# Patient Record
Sex: Female | Born: 1965 | Hispanic: No | Marital: Married | Smoking: Never smoker
Health system: Southern US, Community
[De-identification: ages and names within clinical notes are randomized; demographics above are authoritative.]

## PROBLEM LIST (undated history)

## (undated) DIAGNOSIS — M722 Plantar fascial fibromatosis: Secondary | ICD-10-CM

## (undated) DIAGNOSIS — I10 Essential (primary) hypertension: Secondary | ICD-10-CM

## (undated) DIAGNOSIS — N946 Dysmenorrhea, unspecified: Secondary | ICD-10-CM

## (undated) DIAGNOSIS — N809 Endometriosis, unspecified: Secondary | ICD-10-CM

## (undated) HISTORY — PX: DILATION AND CURETTAGE OF UTERUS: SHX78

---

## 2012-06-01 ENCOUNTER — Encounter (HOSPITAL_COMMUNITY): Payer: Self-pay | Admitting: *Deleted

## 2012-06-01 ENCOUNTER — Emergency Department (HOSPITAL_COMMUNITY)
Admission: EM | Admit: 2012-06-01 | Discharge: 2012-06-01 | Disposition: A | Discharge status: Home / Self Care | Payer: 59 | Source: Home / Self Care | Attending: Emergency Medicine | Admitting: Emergency Medicine

## 2012-06-01 DIAGNOSIS — M7700 Medial epicondylitis, unspecified elbow: Secondary | ICD-10-CM

## 2012-06-01 DIAGNOSIS — M5412 Radiculopathy, cervical region: Secondary | ICD-10-CM

## 2012-06-01 MED ORDER — MELOXICAM 15 MG PO TABS: 15.0000 mg | ORAL_TABLET | Freq: Every day | ORAL | Status: AC

## 2012-06-01 MED ORDER — PREDNISONE 10 MG PO TABS: ORAL_TABLET | ORAL | Status: DC

## 2012-06-01 NOTE — ED Notes (Signed)
Pt  Reports  Symptoms  Of  l  Arm  Weakness       X  Se  Months   With pain l  Elbow region  -  Pt  Reports      The  Symptoms  Worse  This  Am     -  She  denys  Any specefic  Injury      She  denys   Any headache     -  Speech is  Clear  Ambulated  With a  Steady  Fluid  Gait

## 2012-06-01 NOTE — ED Provider Notes (Signed)
Chief Complaint  Patient presents with  . 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 She's had a 6 month history of intermittent left arm numbness from the shoulder on down to the tips of the fingers, weakness, and pain. This occurs intermittently and seems to be worse with hard physical work and better with rest. She has some aching and stiffness in the neck, but has a full range of motion, and the pain in the arm is not brought on by neck movement. There is nothing particular that seems to bring on the arm pain except for hard physical labor. Particularly movement of the shoulder or the elbow does not reproduce the pain. She notes cramping in the muscles of the arm with any type of work. She also has medial elbow pain and swelling. This is worse if she lifts. She denies any right arm symptoms with the exception of pain at the base of the right thumb and triggering of the right thumb.  Review of Systems:  Other than noted above, the patient denies any of the following symptoms: Systemic:  No fevers, chills, sweats, or aches.  No fatigue or tiredness. Musculoskeletal:  No joint pain, arthritis, bursitis, swelling, back pain, or neck pain. Neurological:  No muscular weakness, paresthesias, headache, or trouble with speech or coordination.  No dizziness.   PMFSH:  Past medical history, family history, social history, meds, and allergies were reviewed.  Physical Exam:   Vital signs:  BP 144/93  Pulse 84  Temp 98.6 F (37 C) (Oral)  Resp 18  SpO2 100%  LMP 05/28/2012 Gen:  Alert and oriented times 3.  In no distress. Musculoskeletal: She has slight pain to palpation over the trapezius ridges in her neck had a full range of motion with slight pain at the endpoints of movement. There was no pain to palpation over the shoulder. The shoulder have full range of motion with slight pain on abduction, flexion, and internal and  external rotation. There is no pain to palpation the upper arm. She has pain to palpation over the medial epicondyle but not over the lateral epicondyle and there is none over the antecubital fossa. The elbow joint has a full range of motion with no pain and is no pain over the forearm, wrist, hand, MCP joints, PIP joints, or DIP joints. Tinel's sign was negative but Phalen sign was positive. Otherwise, all joints had a full a ROM with no swelling, bruising or deformity.  No edema, pulses full. Extremities were warm and pink.  Capillary refill was brisk.  Skin:  Clear, warm and dry.  No rash. Neuro:  Alert and oriented times 3.  Muscle strength was normal.  Sensation was intact to light touch.   Assessment:  The primary encounter diagnosis was Medial epicondylitis. A diagnosis of Cervical radiculopathy was also pertinent to this visit.  Plan:   1.  The following meds were prescribed:   New Prescriptions   MELOXICAM (MOBIC) 15 MG TABLET    Take 1 tablet (15 mg total) by mouth daily.   PREDNISONE (DELTASONE) 10 MG TABLET    Take 4 tabs daily for 4 days, 3 tabs daily for 4 days, 2 tabs daily for 4 days, then 1 tab daily for 4 days.   2.  The patient was instructed in symptomatic care, including rest and activity, elevation, application of ice and compression.  Appropriate handouts were given. 3.  The patient was told to return if becoming worse in any way, if no better in 3 or 4 days, and given some red flag symptoms that would indicate earlier return.   4.  The patient was told to follow up with physical therapy for a month. If no better at the end of that time suggested she come back here at that point I think it would be time for her to see both an orthopedic surgeon for the medial epicondylitis and a neurosurgeon for the cervical radiculopathy. She may benefit from an MRI of the neck and possibly epidural steroids.   Reuben Likes, MD 06/01/12 [phone]

## 2012-06-27 ENCOUNTER — Ambulatory Visit: Payer: 59 | Admitting: Rehabilitative and Restorative Service Providers"

## 2012-06-27 ENCOUNTER — Ambulatory Visit: Payer: 59 | Attending: Emergency Medicine

## 2012-06-27 DIAGNOSIS — M542 Cervicalgia: Secondary | ICD-10-CM | POA: Insufficient documentation

## 2012-06-27 DIAGNOSIS — M25519 Pain in unspecified shoulder: Secondary | ICD-10-CM | POA: Insufficient documentation

## 2012-06-27 DIAGNOSIS — IMO0001 Reserved for inherently not codable concepts without codable children: Secondary | ICD-10-CM | POA: Insufficient documentation

## 2012-06-28 NOTE — ED Notes (Signed)
Referral for PT faxed 7/31 and confirmation received. First appt. Was 8/6 @ 1045.  Dr. Lorenz Coaster notified referral completed. Vassie Moselle 06/28/2012

## 2012-07-03 ENCOUNTER — Ambulatory Visit: Payer: 59 | Admitting: Rehabilitative and Restorative Service Providers"

## 2012-07-06 ENCOUNTER — Ambulatory Visit: Payer: 59 | Admitting: Rehabilitative and Restorative Service Providers"

## 2012-07-10 ENCOUNTER — Encounter: Payer: 59 | Admitting: Rehabilitative and Restorative Service Providers"

## 2012-07-13 ENCOUNTER — Ambulatory Visit: Payer: 59 | Admitting: Rehabilitative and Restorative Service Providers"

## 2013-04-02 ENCOUNTER — Ambulatory Visit: Payer: 59 | Admitting: Internal Medicine

## 2014-05-15 ENCOUNTER — Encounter (HOSPITAL_COMMUNITY): Payer: Self-pay | Admitting: Emergency Medicine

## 2014-05-15 ENCOUNTER — Emergency Department (HOSPITAL_COMMUNITY)
Admission: EM | Admit: 2014-05-15 | Discharge: 2014-05-15 | Disposition: A | Discharge status: Home / Self Care | Payer: 59 | Source: Home / Self Care | Attending: Emergency Medicine | Admitting: Emergency Medicine

## 2014-05-15 DIAGNOSIS — N939 Abnormal uterine and vaginal bleeding, unspecified: Secondary | ICD-10-CM

## 2014-05-15 DIAGNOSIS — N898 Other specified noninflammatory disorders of vagina: Secondary | ICD-10-CM

## 2014-05-15 LAB — POCT I-STAT, CHEM 8
BUN: 8 mg/dL (ref 6–23)
CALCIUM ION: 1.21 mmol/L (ref 1.12–1.23)
CHLORIDE: 104 meq/L (ref 96–112)
Creatinine, Ser: 0.6 mg/dL (ref 0.50–1.10)
Glucose, Bld: 99 mg/dL (ref 70–99)
HEMATOCRIT: 29 % — AB (ref 36.0–46.0)
Hemoglobin: 9.9 g/dL — ABNORMAL LOW (ref 12.0–15.0)
Potassium: 3.7 mEq/L (ref 3.7–5.3)
SODIUM: 141 meq/L (ref 137–147)
TCO2: 23 mmol/L (ref 0–100)

## 2014-05-15 LAB — POCT PREGNANCY, URINE: PREG TEST UR: NEGATIVE

## 2014-05-15 MED ORDER — FERROUS SULFATE 325 (65 FE) MG PO TABS: 325.0000 mg | ORAL_TABLET | Freq: Every day | ORAL | Status: DC

## 2014-05-15 NOTE — Discharge Instructions (Signed)
Abnormal Uterine Bleeding Abnormal uterine bleeding can affect women at various stages in life, including teenagers, women in their reproductive years, pregnant women, and women who have reached menopause. Several kinds of uterine bleeding are considered abnormal, including:  Bleeding or spotting between periods.   Bleeding after sexual intercourse.   Bleeding that is heavier or more than normal.   Periods that last longer than usual.  Bleeding after menopause.  Many cases of abnormal uterine bleeding are minor and simple to treat, while others are more serious. Any type of abnormal bleeding should be evaluated by your health care provider. Treatment will depend on the cause of the bleeding. HOME CARE INSTRUCTIONS Monitor your condition for any changes. The following actions may help to alleviate any discomfort you are experiencing:  Avoid the use of tampons and douches as directed by your health care provider.  Change your pads frequently. You should get regular pelvic exams and Pap tests. Keep all follow-up appointments for diagnostic tests as directed by your health care provider.  SEEK MEDICAL CARE IF:   Your bleeding lasts more than 1 week.   You feel dizzy at times.  SEEK IMMEDIATE MEDICAL CARE IF:   You pass out.   You are changing pads every 15 to 30 minutes.   You have abdominal pain.  You have a fever.   You become sweaty or weak.   You are passing large blood clots from the vagina.   You start to feel nauseous and vomit. MAKE SURE YOU:   Understand these instructions.  Will watch your condition.  Will get help right away if you are not doing well or get worse. Document Released: 11/08/2005 Document Revised: 11/13/2013 Document Reviewed: 06/07/2013 Exit[employer] Patient Information 2015 [employer]. This information is not intended to replace advice given to you by your health care provider. Make sure you discuss any questions you have with your  health care provider.  

## 2014-05-15 NOTE — ED Notes (Signed)
Patient here today for prolong vaginal bleeding.  Reports having period in beginning of may and bleeding has continued.  Spouse and patient report she cannot sleep and they have concerns for this.  Patient has established a new patient appt with dr Manuela Schwartz [facility].  appt scheduled for 6/29.  Denies pain.

## 2014-05-15 NOTE — ED Provider Notes (Signed)
CSN: [id number]     Arrival date & time 05/15/14  3300 History   First MD Initiated Contact with Patient 05/15/14 [phone]     Chief Complaint  Patient presents with  . Vaginal Bleeding   (Consider location/radiation/quality/duration/timing/severity/associated sxs/prior Treatment) Patient is a 48 y.o. female presenting with vaginal bleeding. The history is provided by the patient. No language interpreter was used.  Vaginal Bleeding Quality:  Clots and bright red Severity:  Moderate Onset quality:  Gradual Duration:  2 months Timing:  Constant Progression:  Worsening Chronicity:  New Menstrual history:  Irregular and missed period Possible pregnancy: no   Relieved by:  Nothing Worsened by:  Nothing tried Ineffective treatments:  None tried Associated symptoms: no abdominal pain   Pt is scheduled to see Gyn on Friday  History reviewed. No pertinent past medical history. Past Surgical History  Procedure Laterality Date  . Cesarean section     History reviewed. No pertinent family history. History  Substance Use Topics  . Smoking status: Not on file  . Smokeless tobacco: Not on file  . Alcohol Use:    OB History   Grav Para Term Preterm Abortions TAB SAB Ect Mult Living                 Review of Systems  Gastrointestinal: Negative for abdominal pain.  Genitourinary: Positive for vaginal bleeding.  All other systems reviewed and are negative.   Allergies  Review of patient's allergies indicates no known allergies.  Home Medications   Prior to Admission medications   Medication Sig Start Date End Date Taking? Authorizing Provider  predniSONE (DELTASONE) 10 MG tablet Take 4 tabs daily for 4 days, 3 tabs daily for 4 days, 2 tabs daily for 4 days, then 1 tab daily for 4 days. 06/01/12   Harden Mo, MD   BP 154/89  Pulse 79  Temp(Src) 98.2 F (36.8 C) (Oral)  Resp 16  SpO2 100% Physical Exam  Nursing note and vitals reviewed. Constitutional: She is oriented to  person, place, and time. She appears well-developed and well-nourished.  HENT:  Head: Normocephalic.  Eyes: Conjunctivae are normal. Pupils are equal, round, and reactive to light.  Neck: Normal range of motion. Neck supple.  Cardiovascular: Normal rate.   Pulmonary/Chest: Effort normal.  Abdominal: Soft.  Genitourinary: Vaginal discharge found.  Adnexa no mass,  Uterus  No enlargement  Musculoskeletal: Normal range of motion.  Neurological: She is alert and oriented to person, place, and time. She has normal reflexes.  Skin: Skin is warm.  Psychiatric: She has a normal mood and affect.    ED Course  Procedures (including critical care time) Labs Review Labs Reviewed - No data to display  Imaging Review No results found.   MDM   1. Abnormal vaginal bleeding    Iron,  Follow up with gyn as scheduled    Fransico Meadow, PA-C 05/15/14 1135

## 2014-05-16 NOTE — ED Provider Notes (Signed)
Medical screening examination/treatment/procedure(s) were performed by a resident physician or non-physician practitioner and as the supervising physician I was immediately available for consultation/collaboration.  Lynne Leader, MD    Gregor Hams, MD 05/16/14 [phone]

## 2014-06-23 ENCOUNTER — Inpatient Hospital Stay (HOSPITAL_COMMUNITY)
Admission: AD | Admit: 2014-06-23 | Discharge: 2014-06-23 | Disposition: A | Discharge status: Home / Self Care | Payer: 59 | Source: Ambulatory Visit | Attending: Obstetrics and Gynecology | Admitting: Obstetrics and Gynecology

## 2014-06-23 ENCOUNTER — Encounter (HOSPITAL_COMMUNITY): Payer: Self-pay | Admitting: *Deleted

## 2014-06-23 DIAGNOSIS — N898 Other specified noninflammatory disorders of vagina: Secondary | ICD-10-CM

## 2014-06-23 DIAGNOSIS — N938 Other specified abnormal uterine and vaginal bleeding: Secondary | ICD-10-CM | POA: Insufficient documentation

## 2014-06-23 DIAGNOSIS — R5381 Other malaise: Secondary | ICD-10-CM | POA: Insufficient documentation

## 2014-06-23 DIAGNOSIS — N949 Unspecified condition associated with female genital organs and menstrual cycle: Secondary | ICD-10-CM | POA: Insufficient documentation

## 2014-06-23 DIAGNOSIS — R5383 Other fatigue: Secondary | ICD-10-CM

## 2014-06-23 DIAGNOSIS — N939 Abnormal uterine and vaginal bleeding, unspecified: Secondary | ICD-10-CM

## 2014-06-23 HISTORY — DX: Dysmenorrhea, unspecified: N94.6

## 2014-06-23 LAB — CBC
HCT: 28 % — ABNORMAL LOW (ref 36.0–46.0)
Hemoglobin: 8.7 g/dL — ABNORMAL LOW (ref 12.0–15.0)
MCH: 23.3 pg — ABNORMAL LOW (ref 26.0–34.0)
MCHC: 31.1 g/dL (ref 30.0–36.0)
MCV: 74.9 fL — ABNORMAL LOW (ref 78.0–100.0)
PLATELETS: 357 10*3/uL (ref 150–400)
RBC: 3.74 MIL/uL — ABNORMAL LOW (ref 3.87–5.11)
RDW: 20.4 % — AB (ref 11.5–15.5)
WBC: 5.4 10*3/uL (ref 4.0–10.5)

## 2014-06-23 LAB — BASIC METABOLIC PANEL
ANION GAP: 14 (ref 5–15)
BUN: 14 mg/dL (ref 6–23)
CHLORIDE: 105 meq/L (ref 96–112)
CO2: 20 mEq/L (ref 19–32)
Calcium: 8.5 mg/dL (ref 8.4–10.5)
Creatinine, Ser: 0.64 mg/dL (ref 0.50–1.10)
GFR calc non Af Amer: >90 mL/min (ref >90)
Glucose, Bld: 85 mg/dL (ref 70–99)
POTASSIUM: 3.7 meq/L (ref 3.7–5.3)
SODIUM: 139 meq/L (ref 137–147)

## 2014-06-23 LAB — WET PREP, GENITAL
Clue Cells Wet Prep HPF POC: NONE SEEN
TRICH WET PREP: NONE SEEN
YEAST WET PREP: NONE SEEN

## 2014-06-23 LAB — POCT PREGNANCY, URINE: PREG TEST UR: NEGATIVE

## 2014-06-23 MED ORDER — MEGESTROL ACETATE 20 MG PO TABS: ORAL_TABLET | ORAL | Status: DC

## 2014-06-23 NOTE — MAU Note (Signed)
Pt presents to MAU with complaints of heavy vaginal bleeding that started on July the 21st and is still having bleeding. Reports feeling weak and tired.

## 2014-06-23 NOTE — MAU Provider Note (Signed)
CSN: [id number]     Arrival date & time 06/23/14  1629 History   None    Chief Complaint  Patient presents with  . Vaginal Bleeding     (Consider location/radiation/quality/duration/timing/severity/associated sxs/prior Treatment) HPI Andrea Long is 48 y.o. G3P1011 who presents to the MAU with heavy vaginal bleeding. She states that she has had the problem for over a month. She was evaluated in the office and had an ultrasound that showed adenomyosis. She was started on Megace and iron.  She reports the bleeding has been heavy since July 21st and now she is feeling weak and dizzy. She has a follow up appointment with DR. Bovard toward the end of the month but felt like she could not wait until then.    Past Medical History  Diagnosis Date  . Dysmenorrhea    Past Surgical History  Procedure Laterality Date  . Cesarean section     No family history on file. History  Substance Use Topics  . Smoking status: Never Smoker   . Smokeless tobacco: Never Used  . Alcohol Use: No   OB History   Grav Para Term Preterm Abortions TAB SAB Ect Mult Living   3 2 1  1  1   1      Review of Systems  Gastrointestinal: Positive for abdominal pain.  Genitourinary: Positive for vaginal bleeding.  Neurological: Positive for light-headedness.  All other systems are negative    Allergies  Review of patient's allergies indicates no known allergies.  Home Medications   Prior to Admission medications   Medication Sig Start Date End Date Taking? Authorizing Provider  ferrous sulfate 325 (65 FE) MG tablet Take 1 tablet (325 mg total) by mouth daily. 05/15/14  Yes Hollace Kinnier Sofia, PA-C  ibuprofen (ADVIL,MOTRIN) 200 MG tablet Take 400 mg by mouth every 4 (four) hours as needed for moderate pain.   Yes Historical Provider, MD  megestrol (MEGACE) 40 MG tablet Take 40 mg by mouth daily.   Yes Historical Provider, MD  megestrol (MEGACE) 20 MG tablet Take two tablets (40 mg) three times per day time three  days,  then take two tablets (40 mg) two times per day time three days,  then take two tablets (40 mg)once per day 06/23/14   Ashley Murrain, NP   BP 156/86  Pulse 85  Temp(Src) 98.8 F (37.1 C) (Oral)  Resp 18  Ht 4\' 10"  (1.473 m)  Wt 162 lb (73.483 kg)  BMI 33.87 kg/m2  LMP 06/11/2014 Physical Exam  Nursing note and vitals reviewed. Constitutional: She is oriented to person, place, and time. She appears well-developed and well-nourished.  HENT:  Head: Normocephalic.  Eyes: EOM are normal.  Neck: Neck supple.  Cardiovascular: Normal rate.   Pulmonary/Chest: Effort normal.  Abdominal: Soft. There is no tenderness.  Genitourinary:  External genitalia without lesions, moderate blood vaginal vault. No CMT, no adnexal tenderness. Uterus slightly enlarged.   Musculoskeletal: Normal range of motion.  Neurological: She is alert and oriented to person, place, and time. No cranial nerve deficit.  Skin: Skin is warm and dry.  Psychiatric: She has a normal mood and affect. Her behavior is normal.    ED Course  Procedures  MDM   Results for orders placed during the hospital encounter of 06/23/14 (from the past 24 hour(s))  POCT PREGNANCY, URINE     Status: None   Collection Time    06/23/14  5:19 PM      Result Value Ref  Range   Preg Test, Ur NEGATIVE  NEGATIVE  CBC     Status: Abnormal   Collection Time    06/23/14  5:40 PM      Result Value Ref Range   WBC 5.4  4.0 - 10.5 K/uL   RBC 3.74 (*) 3.87 - 5.11 MIL/uL   Hemoglobin 8.7 (*) 12.0 - 15.0 g/dL   HCT 28.0 (*) 36.0 - 46.0 %   MCV 74.9 (*) 78.0 - 100.0 fL   MCH 23.3 (*) 26.0 - 34.0 pg   MCHC 31.1  30.0 - 36.0 g/dL   RDW 20.4 (*) 11.5 - 15.5 %   Platelets 357  150 - 400 K/uL  BASIC METABOLIC PANEL     Status: None   Collection Time    06/23/14  5:40 PM      Result Value Ref Range   Sodium 139  137 - 147 mEq/L   Potassium 3.7  3.7 - 5.3 mEq/L   Chloride 105  96 - 112 mEq/L   CO2 20  19 - 32 mEq/L   Glucose, Bld 85  70  - 99 mg/dL   BUN 14  6 - 23 mg/dL   Creatinine, Ser 0.64  0.50 - 1.10 mg/dL   Calcium 8.5  8.4 - 10.5 mg/dL   GFR calc non Af Amer >90  >90 mL/min   GFR calc Af Amer >90  >90 mL/min   Anion gap 14  5 - 15  WET PREP, GENITAL     Status: Abnormal   Collection Time    06/23/14  5:57 PM      Result Value Ref Range   Yeast Wet Prep HPF POC NONE SEEN  NONE SEEN   Trich, Wet Prep NONE SEEN  NONE SEEN   Clue Cells Wet Prep HPF POC NONE SEEN  NONE SEEN   WBC, Wet Prep HPF POC FEW (*) NONE SEEN    48 y.o. female with abnormal vaginal bleeding and feeling tired. Has appointment for follow up with Dr. Melba Coon for endometrial biopsy. Stable for discharge. I spoke with Dr. Melba Coon and she will try to move the appointment to see the patient sooner. Discussed with the patient and all questioned fully answered. She will return if any problems arise. Will increase dose of Megace and start Fe.    Medication List         ferrous sulfate 325 (65 FE) MG tablet  Take 1 tablet (325 mg total) by mouth daily.     ibuprofen 200 MG tablet  Commonly known as:  ADVIL,MOTRIN  Take 400 mg by mouth every 4 (four) hours as needed for moderate pain.     megestrol 20 MG tablet  Commonly known as:  MEGACE  - Take two tablets (40 mg) three times per day time three days,   - then take two tablets (40 mg) two times per day time three days,   - then take two tablets (40 mg)once per day

## 2014-06-24 LAB — GC/CHLAMYDIA PROBE AMP
CT Probe RNA: NEGATIVE
GC PROBE AMP APTIMA: NEGATIVE

## 2014-09-23 ENCOUNTER — Encounter (HOSPITAL_COMMUNITY): Payer: Self-pay | Admitting: *Deleted

## 2014-11-22 DIAGNOSIS — N809 Endometriosis, unspecified: Secondary | ICD-10-CM

## 2014-11-22 HISTORY — DX: Endometriosis, unspecified: N80.9

## 2014-12-06 ENCOUNTER — Encounter: Payer: Self-pay | Admitting: Internal Medicine

## 2014-12-06 ENCOUNTER — Ambulatory Visit (INDEPENDENT_AMBULATORY_CARE_PROVIDER_SITE_OTHER): Payer: 59 | Admitting: Internal Medicine

## 2014-12-06 VITALS — BP 143/72 | HR 67 | Temp 98.1°F | Ht 60.0 in | Wt 160.1 lb

## 2014-12-06 DIAGNOSIS — S63602A Unspecified sprain of left thumb, initial encounter: Secondary | ICD-10-CM

## 2014-12-06 DIAGNOSIS — Z23 Encounter for immunization: Secondary | ICD-10-CM

## 2014-12-06 DIAGNOSIS — N924 Excessive bleeding in the premenopausal period: Secondary | ICD-10-CM

## 2014-12-06 DIAGNOSIS — D500 Iron deficiency anemia secondary to blood loss (chronic): Secondary | ICD-10-CM | POA: Insufficient documentation

## 2014-12-06 NOTE — Patient Instructions (Signed)
General Instructions: I want you to take 2 Ibuprofen every 6 hours as needed for you thumb pain, putting ice on it may help to.  You can also buy an over the counter thumb splint to help with it.  If it does not get better within 1-2 weeks please come back otherwise we can see you in 6 months.  Please bring your medicines with you each time you come to clinic.  Medicines may include prescription medications, over-the-counter medications, herbal remedies, eye drops, vitamins, or other pills.   Progress Toward Treatment Goals:  No flowsheet data found.  Self Care Goals & Plans:  No flowsheet data found.  No flowsheet data found.   Care Management & Community Referrals:  No flowsheet data found.

## 2014-12-06 NOTE — Progress Notes (Signed)
[facility] Subjective:   Patient ID: Andrea Long female   DOB: 1966-07-29 49 y.o.   MRN: [id number]  HPI: [name] is a 49 y.o. filipino female with a PMH dysmenorrhea who presents to our clinic to establish care. She has previously had all of her medical care at her OB/GYN Dr. Melba Coon.   She is married and has one child.  She works at [employer]. She immigrated to the [place] in 2012.  She reports she was received her vaccinations that were required at that time and will bring those records to her next visit. She reports her only complaint today his left thumb pain that started one week ago.  She does not remember what she was doing when it started.  She did not have trauma to the thumb.  She has not taken any medication for the pain.  She notes it is worse with use especially at work. She does not have an history of fractures.   Past Medical History  Diagnosis Date  . Dysmenorrhea    Current Outpatient Prescriptions  Medication Sig Dispense Refill  . ferrous sulfate 325 (65 FE) MG tablet Take 1 tablet (325 mg total) by mouth daily. 30 tablet 0  . megestrol (MEGACE) 20 MG tablet Take two tablets (40 mg) three times per day time three days,  then take two tablets (40 mg) two times per day time three days,  then take two tablets (40 mg)once per day 40 tablet 0  . ibuprofen (ADVIL,MOTRIN) 200 MG tablet Take 400 mg by mouth every 4 (four) hours as needed for moderate pain.     No current facility-administered medications for this visit.   No family history on file. History   Social History  . Marital Status: Married    Spouse Name: N/A    Number of Children: N/A  . Years of Education: N/A   Social History Main Topics  . Smoking status: Never Smoker   . Smokeless tobacco: Never Used  . Alcohol Use: No  . Drug Use: No  . Sexual Activity: Yes    Birth Control/ Protection: Surgical   Other Topics Concern  . None   Social History  Narrative   Review of Systems: Review of Systems  Constitutional: Negative for fever, chills and weight loss.  Eyes: Negative for blurred vision.  Respiratory: Negative for cough and shortness of breath.   Cardiovascular: Negative for chest pain.  Gastrointestinal: Negative for abdominal pain, diarrhea, constipation, blood in stool and melena.  Musculoskeletal: Positive for joint pain. Negative for myalgias, back pain, falls and neck pain.  Neurological: Negative for headaches.  Psychiatric/Behavioral: Negative for depression.     Objective:  Physical Exam: Filed Vitals:   12/06/14 1517  BP: 143/72  Pulse: 67  Temp: 98.1 F (36.7 C)  TempSrc: Oral  Height: 5' (1.524 m)  Weight: 160 lb 1.6 oz (72.621 kg)  SpO2: 100%  Physical Exam  Constitutional: She is well-developed, well-nourished, and in no distress.  Cardiovascular: Normal rate, regular rhythm and normal heart sounds.   Pulmonary/Chest: Effort normal and breath sounds normal. She has no wheezes.  Abdominal: Soft. Bowel sounds are normal.  Musculoskeletal:       Hands: Psychiatric: Affect normal.  Nursing note and vitals reviewed.   Assessment & Plan:  Case discussed with Dr. Dareen Piano  Perimenopausal menorrhagia -Will obtain records from Dr. Roe Rutherford office, consent paperwork obtained from patient.   Iron deficiency anemia due to chronic blood  loss - Will hold off on repeating CBC or other bloodwork and obtain records from Dr. Roe Rutherford office.   Left thumb sprain -History and physical consistant with left thumb sprain, recommended patient use OTC NSAIDs.  Patient instructed she may also buy OTC splint for her tumb.  Instructed to RTC if pain worsens or does not improve within 1-2 weeks.     Medications Ordered No orders of the defined types were placed in this encounter.   Other Orders Orders Placed This Encounter  Procedures  . Flu Vaccine QUAD 36+ mos IM

## 2014-12-08 NOTE — Assessment & Plan Note (Addendum)
-  History and physical consistant with left thumb sprain, recommended patient use OTC NSAIDs.  Patient instructed she may also buy OTC splint for her tumb.  Instructed to RTC if pain worsens or does not improve within 1-2 weeks.

## 2014-12-08 NOTE — Assessment & Plan Note (Signed)
-   Will hold off on repeating CBC or other bloodwork and obtain records from Dr. Roe Rutherford office.

## 2014-12-08 NOTE — Assessment & Plan Note (Addendum)
-  Will obtain records from Dr. Roe Rutherford office, consent paperwork obtained from patient.

## 2014-12-11 NOTE — Progress Notes (Signed)
INTERNAL MEDICINE TEACHING ATTENDING ADDENDUM - Ranveer Wahlstrom, MD: I reviewed and discussed at the time of visit with the resident Dr. Hoffman, the patient's medical history, physical examination, diagnosis and results of pertinent tests and treatment and I agree with the patient's care as documented.  

## 2014-12-20 ENCOUNTER — Ambulatory Visit: Payer: 59 | Admitting: Internal Medicine

## 2015-03-05 ENCOUNTER — Encounter: Payer: Self-pay | Admitting: Internal Medicine

## 2015-03-05 ENCOUNTER — Ambulatory Visit (INDEPENDENT_AMBULATORY_CARE_PROVIDER_SITE_OTHER): Payer: 59 | Admitting: Internal Medicine

## 2015-03-05 VITALS — BP 141/86 | HR 67 | Temp 98.1°F | Wt 160.0 lb

## 2015-03-05 DIAGNOSIS — D500 Iron deficiency anemia secondary to blood loss (chronic): Secondary | ICD-10-CM

## 2015-03-05 DIAGNOSIS — Z23 Encounter for immunization: Secondary | ICD-10-CM

## 2015-03-05 DIAGNOSIS — E669 Obesity, unspecified: Secondary | ICD-10-CM | POA: Diagnosis not present

## 2015-03-05 DIAGNOSIS — IMO0001 Reserved for inherently not codable concepts without codable children: Secondary | ICD-10-CM | POA: Insufficient documentation

## 2015-03-05 DIAGNOSIS — R030 Elevated blood-pressure reading, without diagnosis of hypertension: Secondary | ICD-10-CM

## 2015-03-05 DIAGNOSIS — Z6831 Body mass index (BMI) 31.0-31.9, adult: Secondary | ICD-10-CM | POA: Diagnosis not present

## 2015-03-05 DIAGNOSIS — M722 Plantar fascial fibromatosis: Secondary | ICD-10-CM | POA: Insufficient documentation

## 2015-03-05 DIAGNOSIS — Z0000 Encounter for general adult medical examination without abnormal findings: Secondary | ICD-10-CM | POA: Insufficient documentation

## 2015-03-05 DIAGNOSIS — J302 Other seasonal allergic rhinitis: Secondary | ICD-10-CM

## 2015-03-05 LAB — LIPID PANEL
Cholesterol: 122 mg/dL (ref 0–200)
HDL: 38 mg/dL — ABNORMAL LOW (ref >=46)
LDL CALC: 56 mg/dL (ref 0–99)
Total CHOL/HDL Ratio: 3.2 Ratio
Triglycerides: 142 mg/dL (ref <150)
VLDL: 28 mg/dL (ref 0–40)

## 2015-03-05 LAB — CBC WITH DIFFERENTIAL/PLATELET
BASOS PCT: 0 % (ref 0–1)
Basophils Absolute: 0 10*3/uL (ref 0.0–0.1)
Eosinophils Absolute: 0.4 10*3/uL (ref 0.0–0.7)
Eosinophils Relative: 5 % (ref 0–5)
HEMATOCRIT: 36.6 % (ref 36.0–46.0)
Hemoglobin: 12.4 g/dL (ref 12.0–15.0)
LYMPHS ABS: 1.7 10*3/uL (ref 0.7–4.0)
Lymphocytes Relative: 23 % (ref 12–46)
MCH: 30.6 pg (ref 26.0–34.0)
MCHC: 33.9 g/dL (ref 30.0–36.0)
MCV: 90.4 fL (ref 78.0–100.0)
MONOS PCT: 8 % (ref 3–12)
MPV: 10.8 fL (ref 8.6–12.4)
Monocytes Absolute: 0.6 10*3/uL (ref 0.1–1.0)
NEUTROS ABS: 4.9 10*3/uL (ref 1.7–7.7)
NEUTROS PCT: 64 % (ref 43–77)
PLATELETS: 296 10*3/uL (ref 150–400)
RBC: 4.05 MIL/uL (ref 3.87–5.11)
RDW: 13.2 % (ref 11.5–15.5)
WBC: 7.6 10*3/uL (ref 4.0–10.5)

## 2015-03-05 MED ORDER — MOMETASONE FUROATE 50 MCG/ACT NA SUSP: 2.0000 | Freq: Every day | NASAL | Status: DC

## 2015-03-05 MED ORDER — CETIRIZINE HCL 10 MG PO TABS: 10.0000 mg | ORAL_TABLET | Freq: Every day | ORAL | Status: DC

## 2015-03-05 NOTE — Progress Notes (Signed)
[facility] Subjective:   Patient ID: Andrea Long female   DOB: 1965/11/29 49 y.o.   MRN: [id number]  HPI: Andrea Long is a 49 y.o. female with a PMH detailed below who presents for 3 month follow up.  She recently established care here.  She has a history of dysmenorrhea and IDA.  She has been cared for by GYN and she reports this issue has been resolved.  She notes that her energy has improved and she believes her blood counts are back to normal.  She is interested in getting up to date on her blood work and immunizations.  She does report that her thumb pain has resolved, it occasionally comes back and she take ibuprofen as needed for it.  She is complaining today of some occasional right heel pain that is worse in the morning and gets better throughout the day.  She denies any trauma to the area.  She does report a cough for the 2-3 weeks, this has coincided with nasal congestion, post nasal drip and itchy eyes.  She reports she has recently started taking Zyrtec OTC with some relief but is wondering about a nasal spray like her husband takes.  Past Medical History  Diagnosis Date  . Dysmenorrhea    Current Outpatient Prescriptions  Medication Sig Dispense Refill  . cetirizine (ZYRTEC ALLERGY) 10 MG tablet Take 1 tablet (10 mg total) by mouth daily.    . ferrous sulfate 325 (65 FE) MG tablet Take 1 tablet (325 mg total) by mouth daily. 30 tablet 0  . ibuprofen (ADVIL,MOTRIN) 200 MG tablet Take 400 mg by mouth every 4 (four) hours as needed for moderate pain.    . megestrol (MEGACE) 20 MG tablet Take two tablets (40 mg) three times per day time three days,  then take two tablets (40 mg) two times per day time three days,  then take two tablets (40 mg)once per day 40 tablet 0  . mometasone (NASONEX) 50 MCG/ACT nasal spray Place 2 sprays into the nose daily. 17 g 2   No current facility-administered medications for this visit.   No family history  on file. History   Social History  . Marital Status: Married    Spouse Name: N/A  . Number of Children: N/A  . Years of Education: N/A   Social History Main Topics  . Smoking status: Never Smoker   . Smokeless tobacco: Never Used  . Alcohol Use: No  . Drug Use: No  . Sexual Activity: Yes    Birth Control/ Protection: Surgical   Other Topics Concern  . None   Social History Narrative   Review of Systems: Review of Systems  Constitutional: Negative for fever, chills, weight loss and malaise/fatigue.  HENT: Positive for congestion.   Eyes: Negative for blurred vision.  Respiratory: Positive for cough. Negative for shortness of breath.   Cardiovascular: Negative for chest pain and leg swelling.  Gastrointestinal: Negative for heartburn and abdominal pain.  Genitourinary: Negative for dysuria.  Musculoskeletal: Negative for myalgias.  Neurological: Negative for dizziness and headaches.  Endo/Heme/Allergies: Negative for polydipsia.  Psychiatric/Behavioral: Negative for substance abuse.     Objective:  Physical Exam: Filed Vitals:   03/05/15 1600  BP: 141/86  Pulse: 67  Temp: 98.1 F (36.7 C)  TempSrc: Oral  Weight: 160 lb (72.576 kg)  SpO2: 100%  Physical Exam  Constitutional: She is well-developed, well-nourished, and in no distress. No distress.  HENT:  Head: Normocephalic and  atraumatic.  Eyes: Conjunctivae are normal.  Cardiovascular: Normal rate, regular rhythm, normal heart sounds and intact distal pulses.   No murmur heard. Pulmonary/Chest: Effort normal and breath sounds normal. She has no wheezes. She exhibits no tenderness.  Abdominal: Soft. Bowel sounds are normal.  Musculoskeletal: She exhibits no edema.  Right heel pain at insertion of plantar fascia to calcaneus.  Skin: Skin is warm and dry. She is not diaphoretic.  Psychiatric: Affect and judgment normal.  Nursing note and vitals reviewed.    Assessment & Plan:  Case discussed with Dr.  Ellwood Dense  Elevated blood pressure She again has a very mildly elevated BP with a systolic of 702.  I am hesitant to label her as a hypertensive at this time.  I discussed this with her and educated her about low salt diet, and increasing exercise. - She will follow up for recheck in 3 months.   Iron deficiency anemia due to chronic blood loss -Repeat CBC today shows Hgb back to normal.   Health care maintenance - Patient has been getting regular mammorgrams and pap smears from OBGYN -Interested in Tdap vaccine today - order lipid panel.   Obesity (BMI 30.0-34.9) -We discussed her BMI of 31 and weight loss strategies.   Plantar fasciitis of right foot - Discussed exercises, using tennis ball and ice bottle for treatment.  Can use occasional NSAIDs.   Seasonal allergies - Recommended she continue Zyrtec 10mg  daily - Can add Nasal spray will Rx Nasonex but patient informed she could also use OTC nasocort or flonase.     Medications Ordered Meds ordered this encounter  Medications  . cetirizine (ZYRTEC ALLERGY) 10 MG tablet    Sig: Take 1 tablet (10 mg total) by mouth daily.  . mometasone (NASONEX) 50 MCG/ACT nasal spray    Sig: Place 2 sprays into the nose daily.    Dispense:  17 g    Refill:  2   Other Orders Orders Placed This Encounter  Procedures  . Tdap vaccine greater than or equal to 7yo IM  . CBC with Diff  . Lipid Profile

## 2015-03-05 NOTE — Patient Instructions (Signed)
General Instructions: Remember to exercise and limit salt intake.  Please bring your medicines with you each time you come to clinic.  Medicines may include prescription medications, over-the-counter medications, herbal remedies, eye drops, vitamins, or other pills.   Progress Toward Treatment Goals:  No flowsheet data found.  Self Care Goals & Plans:  No flowsheet data found.  No flowsheet data found.   Care Management & Community Referrals:  No flowsheet data found.

## 2015-03-05 NOTE — Assessment & Plan Note (Signed)
She again has a very mildly elevated BP with a systolic of 707.  I am hesitant to label her as a hypertensive at this time.  I discussed this with her and educated her about low salt diet, and increasing exercise. - She will follow up for recheck in 3 months.

## 2015-03-05 NOTE — Progress Notes (Signed)
Internal Medicine Clinic Attending  Case discussed with Dr. Hoffman at the time of the visit.  We reviewed the resident's history and exam and pertinent patient test results.  I agree with the assessment, diagnosis, and plan of care documented in the resident's note.  

## 2015-03-06 ENCOUNTER — Encounter: Payer: Self-pay | Admitting: Internal Medicine

## 2015-03-06 NOTE — Assessment & Plan Note (Signed)
-   Recommended she continue Zyrtec 10mg  daily - Can add Nasal spray will Rx Nasonex but patient informed she could also use OTC nasocort or flonase.

## 2015-03-06 NOTE — Assessment & Plan Note (Signed)
-   Discussed exercises, using tennis ball and ice bottle for treatment.  Can use occasional NSAIDs.

## 2015-03-06 NOTE — Assessment & Plan Note (Signed)
-  We discussed her BMI of 31 and weight loss strategies.

## 2015-03-06 NOTE — Assessment & Plan Note (Signed)
-  Repeat CBC today shows Hgb back to normal.

## 2015-03-06 NOTE — Assessment & Plan Note (Signed)
-   Patient has been getting regular mammorgrams and pap smears from OBGYN -Interested in Tdap vaccine today - order lipid panel.

## 2015-03-07 ENCOUNTER — Ambulatory Visit: Payer: 59 | Admitting: Internal Medicine

## 2015-08-29 ENCOUNTER — Ambulatory Visit (INDEPENDENT_AMBULATORY_CARE_PROVIDER_SITE_OTHER): Payer: 59 | Admitting: *Deleted

## 2015-08-29 DIAGNOSIS — Z23 Encounter for immunization: Secondary | ICD-10-CM

## 2015-09-22 ENCOUNTER — Ambulatory Visit (INDEPENDENT_AMBULATORY_CARE_PROVIDER_SITE_OTHER): Payer: 59 | Admitting: Internal Medicine

## 2015-09-22 ENCOUNTER — Ambulatory Visit (INDEPENDENT_AMBULATORY_CARE_PROVIDER_SITE_OTHER): Payer: 59

## 2015-09-22 ENCOUNTER — Encounter: Payer: Self-pay | Admitting: Internal Medicine

## 2015-09-22 ENCOUNTER — Ambulatory Visit (INDEPENDENT_AMBULATORY_CARE_PROVIDER_SITE_OTHER): Payer: 59 | Admitting: Podiatry

## 2015-09-22 ENCOUNTER — Encounter: Payer: Self-pay | Admitting: Podiatry

## 2015-09-22 VITALS — BP 131/83 | HR 74 | Resp 16

## 2015-09-22 VITALS — BP 132/71 | HR 57 | Temp 98.3°F | Ht <= 58 in | Wt 154.3 lb

## 2015-09-22 DIAGNOSIS — M62838 Other muscle spasm: Secondary | ICD-10-CM

## 2015-09-22 DIAGNOSIS — L309 Dermatitis, unspecified: Secondary | ICD-10-CM

## 2015-09-22 DIAGNOSIS — M79673 Pain in unspecified foot: Secondary | ICD-10-CM

## 2015-09-22 DIAGNOSIS — M722 Plantar fascial fibromatosis: Secondary | ICD-10-CM | POA: Diagnosis not present

## 2015-09-22 MED ORDER — TRIAMCINOLONE ACETONIDE 10 MG/ML IJ SUSP
10.0000 mg | Freq: Once | INTRAMUSCULAR | Status: AC
  Administered 2015-09-22: 10 mg

## 2015-09-22 MED ORDER — MELOXICAM 15 MG PO TABS: 15.0000 mg | ORAL_TABLET | Freq: Every day | ORAL | Status: DC

## 2015-09-22 MED ORDER — CYCLOBENZAPRINE HCL 10 MG PO TABS: 10.0000 mg | ORAL_TABLET | Freq: Three times a day (TID) | ORAL | Status: DC | PRN

## 2015-09-22 NOTE — Patient Instructions (Addendum)
Andrea Long it was nice seeing you today.  -Please take Flexeril 10 mg: 1 tablet by mouth every 8 hours as needed for neck muscle spasms.  You may get drowsy or dizzy when you first start taking the medicine or change doses. Do not drive, use machinery, or do anything that may be dangerous until you know how the medicine affects you. Stand or sit up slowly.  -Hot showers and heating pads may also help.

## 2015-09-22 NOTE — Progress Notes (Signed)
   Subjective:    Patient ID: Andrea Long, female    DOB: May 12, 1966, 49 y.o.   MRN: [id number]  HPI Pt presents with right heel pain x 2 months and dermatitis on the lateral side of her right foot, she c/o itching to area. Heel pain has lasted for awhile, worse in the morning, using nsaids   Review of Systems  All other systems reviewed and are negative.      Objective:   Physical Exam        Assessment & Plan:

## 2015-09-22 NOTE — Patient Instructions (Signed)

## 2015-09-23 DIAGNOSIS — M62838 Other muscle spasm: Secondary | ICD-10-CM | POA: Insufficient documentation

## 2015-09-23 NOTE — Progress Notes (Signed)
Patient ID: Andrea Long, female   DOB: 1966/06/12, 49 y.o.   MRN: [id number]   Subjective:   Patient ID: Andrea Long female   DOB: 1966/09/19 49 y.o.   MRN: [id number]  HPI: [name] is a 49 y.o. female with a past medical history of dysmenorrhea presenting to the clinic with stiffness of neck for the past 1 week. Patient states her neck hurts whenever she tries to look down or sideways. Taking hot showers helps with the stiffness and pain. Denies having any trauma to the neck area. States she works at [employer] for [employer] and her job requires her to look up and down a lot. Denies having any headaches, rash, fevers, chills, nausea, or vomiting. Denies having any weakness, numbness, or tingling in her upper extremities. No other complaints.    Past Medical History  Diagnosis Date  . Dysmenorrhea    Current Outpatient Prescriptions  Medication Sig Dispense Refill  . cetirizine (ZYRTEC ALLERGY) 10 MG tablet Take 1 tablet (10 mg total) by mouth daily.    . cyclobenzaprine (FLEXERIL) 10 MG tablet Take 1 tablet (10 mg total) by mouth every 8 (eight) hours as needed for muscle spasms. 30 tablet 0  . ferrous sulfate 325 (65 FE) MG tablet Take 1 tablet (325 mg total) by mouth daily. 30 tablet 0  . ibuprofen (ADVIL,MOTRIN) 200 MG tablet Take 400 mg by mouth every 4 (four) hours as needed for moderate pain.    . megestrol (MEGACE) 20 MG tablet Take two tablets (40 mg) three times per day time three days,  then take two tablets (40 mg) two times per day time three days,  then take two tablets (40 mg)once per day 40 tablet 0  . meloxicam (MOBIC) 15 MG tablet Take 1 tablet (15 mg total) by mouth daily. 30 tablet 2  . mometasone (NASONEX) 50 MCG/ACT nasal spray Place 2 sprays into the nose daily. 17 g 2   No current facility-administered medications for this visit.   No family history on file. Social History   Social History  . Marital Status: Married    Spouse  Name: N/A  . Number of Children: N/A  . Years of Education: N/A   Social History Main Topics  . Smoking status: Never Smoker   . Smokeless tobacco: Never Used  . Alcohol Use: No  . Drug Use: No  . Sexual Activity: Yes    Birth Control/ Protection: Surgical   Other Topics Concern  . None   Social History Narrative   Review of Systems: Review of Systems  Constitutional: Negative for fever and chills.  HENT: Negative for ear pain.   Eyes: Negative for blurred vision and pain.  Respiratory: Negative for cough, sputum production, shortness of breath and wheezing.   Cardiovascular: Negative for chest pain and leg swelling.  Gastrointestinal: Negative for nausea, vomiting, abdominal pain, diarrhea and constipation.  Musculoskeletal: Positive for neck pain. Negative for myalgias and back pain.  Skin: Negative for itching and rash.  Neurological: Negative for dizziness, sensory change, focal weakness and headaches.   Objective:  Physical Exam: Filed Vitals:   09/22/15 1017  BP: 132/71  Pulse: 57  Temp: 98.3 F (36.8 C)  TempSrc: Oral  Height: 4\' 10"  (1.473 m)  Weight: 154 lb 4.8 oz (69.99 kg)  SpO2: 100%   Physical Exam  Constitutional: She is oriented to person, place, and time. She appears well-developed and well-nourished. No distress.  HENT:  Head: Normocephalic  and atraumatic.  Eyes: EOM are normal. Pupils are equal, round, and reactive to light.  Neck: Neck supple. No tracheal deviation present.  Cardiovascular: Normal rate, regular rhythm and intact distal pulses.   Pulmonary/Chest: Effort normal and breath sounds normal. No respiratory distress. She has no wheezes. She has no rales.  Abdominal: Soft. Bowel sounds are normal. She exhibits no distension. There is no tenderness.  Musculoskeletal: She exhibits tenderness. She exhibits no edema.  Patient is able to extend her neck, however, has decreased range of motion with flexion and lateral rotation of the neck. Neck  muscle stiffness and tenderness on palpation in the distribution of the trapezius muscle. No tenderness on palpation of the cervical spine.  Neurological: She is alert and oriented to person, place, and time.  Strength and sensation grossly intact in bilateral upper extremities.  Skin: Skin is warm and dry.   Assessment & Plan:

## 2015-09-23 NOTE — Assessment & Plan Note (Signed)
Patient complaining of stiffness and decreased ROM with flexion and lateral rotation of neck for the past 1 week. Taking hot showers helps with the stiffness and pain. Denies having any trauma to the neck area. States she works at [employer] for [employer] and her job requires her to look up and down a lot, which could possibly be putting strain on her neck muscles. Denies having any headaches, rash, fevers, chills, nausea, or vomiting. Meningitis is less likely. Denies having any weakness, numbness, or tingling in her upper extremities. No pain with extension of the neck. Cervical spondylosis is less likely. Upon physical examination, patient is able to extend her neck, however, has decreased range of motion with flexion and lateral rotation. She also exhibits neck muscle stiffness and tenderness on palpation in the distribution of the trapezius muscle. No tenderness on palpation of the cervical spine. Strength and sensation grossly intact in bilateral upper extremities. Her symptoms are likely due to neck muscle spasms. -Flexeril 10 mg every 8 when necessary. I reminded the patient that this medication can make her drowsy and that she cannot drive or operate heavy machinery while taking it.

## 2015-09-24 NOTE — Progress Notes (Signed)
I saw and evaluated the patient.  I personally confirmed the key portions of Dr. Rathore's history and exam and reviewed pertinent patient test results.  The assessment, diagnosis, and plan were formulated together and I agree with the documentation in the resident's note. 

## 2015-09-24 NOTE — Progress Notes (Signed)
Subjective:     Patient ID: Andrea Long, female   DOB: 1966-11-18, 49 y.o.   MRN: [id number]  HPI patient presents stating she has a lot of pain in her right heel for the last several months and she's had some itching on the outside of her foot with no history. Patient states the pain is worse in the morning after sitting she's tried anti-inflammatory and shoe gear modifications   Review of Systems  All other systems reviewed and are negative.      Objective:   Physical Exam  Constitutional: She is oriented to person, place, and time.  Cardiovascular: Intact distal pulses.   Musculoskeletal: Normal range of motion.  Neurological: She is oriented to person, place, and time.  Skin: Skin is warm.  Nursing note and vitals reviewed.  neurovascular status found to be intact with muscle strength adequate range of motion within normal limits. Patient is found to have moderate depression of the arch does have obesity which is complicating factor and is found to have significant discomfort in the medial fascial band right at the insertional point of the tendon the calcaneus. There is some discoloration of tissue lateral side right foot but it's localized in nature with no indication of any trauma or other inflammatory manifestation     Assessment:     Acute plantar fasciitis right with inflammation and structural changes of the feet along with dermatitis lateral right    Plan:     H&P and x-rays reviewed and today I injected the right plantar fascia 3 mg Kenalog 5 mg Xylocaine and advised on cortisone cream for the lateral foot and dispensed fascial brace with instructions. Gave instructions on physical therapy and reappoint to recheck

## 2015-09-29 ENCOUNTER — Ambulatory Visit: Payer: 59 | Admitting: Podiatry

## 2016-02-11 ENCOUNTER — Ambulatory Visit (INDEPENDENT_AMBULATORY_CARE_PROVIDER_SITE_OTHER): Payer: BLUE CROSS/BLUE SHIELD | Admitting: Podiatry

## 2016-02-11 ENCOUNTER — Encounter: Payer: Self-pay | Admitting: Podiatry

## 2016-02-11 VITALS — BP 131/77 | HR 84 | Resp 16

## 2016-02-11 DIAGNOSIS — M722 Plantar fascial fibromatosis: Secondary | ICD-10-CM | POA: Diagnosis not present

## 2016-02-11 MED ORDER — TRIAMCINOLONE ACETONIDE 10 MG/ML IJ SUSP
10.0000 mg | Freq: Once | INTRAMUSCULAR | Status: AC
  Administered 2016-02-11: 10 mg

## 2016-02-12 ENCOUNTER — Telehealth: Payer: Self-pay | Admitting: Internal Medicine

## 2016-02-12 NOTE — Progress Notes (Signed)
Subjective:     Patient ID: Andrea Long, female   DOB: 1966-04-22, 50 y.o.   MRN: [id number]  HPI patient presents with a flareup of pain in the right plantar fascia with inflammation fluid around the medial band   Review of Systems     Objective:   Physical Exam Neurovascular status intact muscle strength adequate with inflammatory fasciitis right at the insertional point tendon into the calcaneus    Assessment:     Acute plantar fasciitis right    Plan:     Advised on physical therapy supportive shoe gear usage and I injected the plantar fascia right 3 mg Kenalog 5 g Xylocaine and reappoint to recheck

## 2016-02-12 NOTE — Telephone Encounter (Signed)
APPT REMINDER CALL, NO ANSWER, MAIL BOX FULL °

## 2016-02-13 ENCOUNTER — Encounter: Payer: Self-pay | Admitting: Internal Medicine

## 2016-02-13 ENCOUNTER — Ambulatory Visit (INDEPENDENT_AMBULATORY_CARE_PROVIDER_SITE_OTHER): Payer: BLUE CROSS/BLUE SHIELD | Admitting: Internal Medicine

## 2016-02-13 DIAGNOSIS — Z6830 Body mass index (BMI) 30.0-30.9, adult: Secondary | ICD-10-CM

## 2016-02-13 DIAGNOSIS — M62838 Other muscle spasm: Secondary | ICD-10-CM | POA: Diagnosis not present

## 2016-02-13 DIAGNOSIS — E669 Obesity, unspecified: Secondary | ICD-10-CM

## 2016-02-13 NOTE — Progress Notes (Signed)
   Subjective:    Patient ID: Andrea Long, female    DOB: 14-Jan-1966, 50 y.o.   MRN: [id number]  HPI  Andrea Long is a 50 year old woman with a PMH as below who comes to the clinic to discuss mild neck pain and obesity. She indicates that her neck pain is particularly worse after she's been looking at her phone for extended periods of time, which she does frequently. To address her mild obesity, she has been eating fewer carbohydrates and eating more vegetables and protein. She exercises at least 5 times weekly.   Active Ambulatory Problems    Diagnosis Date Noted  . Perimenopausal menorrhagia 12/06/2014  . Plantar fasciitis of right foot 03/05/2015  . Health care maintenance 03/05/2015  . Seasonal allergies 03/05/2015  . Obesity (BMI 30.0-34.9) 03/05/2015  . Muscle spasms of neck 09/23/2015   Resolved Ambulatory Problems    Diagnosis Date Noted  . Left thumb sprain 12/06/2014  . Iron deficiency anemia due to chronic blood loss 12/06/2014  . Elevated blood pressure 03/05/2015   Past Medical History  Diagnosis Date  . Dysmenorrhea      Review of Systems  Constitutional: Negative for fatigue and unexpected weight change.  Respiratory: Negative for shortness of breath and wheezing.   Musculoskeletal: Positive for neck pain and neck stiffness.  Psychiatric/Behavioral: Negative for dysphoric mood. The patient is not nervous/anxious.        Objective:   Physical Exam  Constitutional: She appears well-developed and well-nourished.  HENT:  Mouth/Throat: Oropharynx is clear and moist. No oropharyngeal exudate.  Neck: Normal range of motion. Neck supple.  Cardiovascular: Normal rate, regular rhythm and intact distal pulses.   Pulmonary/Chest: Effort normal and breath sounds normal. No respiratory distress. She has no wheezes.  Abdominal: Soft. Bowel sounds are normal. She exhibits no distension. There is no tenderness.  Lymphadenopathy:    She has no cervical adenopathy.    Skin: Skin is warm and dry.  Psychiatric: She has a normal mood and affect. Her behavior is normal.          Assessment & Plan:   Please see problem based assessment and plan for details.

## 2016-02-13 NOTE — Patient Instructions (Signed)
Andrea Long,  It was a pleasure seeing you and your husband today. You are in [place] [facility].   Please continue your diet and exercise regimen.

## 2016-02-15 NOTE — Assessment & Plan Note (Signed)
A: She remains borderline low obese, but is making sustained improvement.  P: I encouraged her continued weight loss plan

## 2016-02-15 NOTE — Assessment & Plan Note (Signed)
A: Patient is not currently taking any medication for this. It seems her neck strain is brought on solely by sustained overflexion.  P: Counseled to cut back on smartphone usage and to lift phone up to prevent overflexion.

## 2016-02-16 NOTE — Progress Notes (Signed)
Internal Medicine Clinic Attending  Case discussed with Dr. Ford at the time of the visit.  We reviewed the resident's history and exam and pertinent patient test results.  I agree with the assessment, diagnosis, and plan of care documented in the resident's note.  

## 2016-02-20 ENCOUNTER — Inpatient Hospital Stay (HOSPITAL_COMMUNITY)
Admission: AD | Admit: 2016-02-20 | Discharge: 2016-02-20 | Disposition: A | Discharge status: Home / Self Care | Payer: BLUE CROSS/BLUE SHIELD | Source: Ambulatory Visit | Attending: Obstetrics and Gynecology | Admitting: Obstetrics and Gynecology

## 2016-02-20 ENCOUNTER — Encounter (HOSPITAL_COMMUNITY): Payer: Self-pay

## 2016-02-20 DIAGNOSIS — N939 Abnormal uterine and vaginal bleeding, unspecified: Secondary | ICD-10-CM | POA: Insufficient documentation

## 2016-02-20 HISTORY — DX: Endometriosis, unspecified: N80.9

## 2016-02-20 HISTORY — DX: Plantar fascial fibromatosis: M72.2

## 2016-02-20 LAB — CBC
HEMATOCRIT: 29.3 % — AB (ref 36.0–46.0)
HEMOGLOBIN: 9.9 g/dL — AB (ref 12.0–15.0)
MCH: 29.8 pg (ref 26.0–34.0)
MCHC: 33.8 g/dL (ref 30.0–36.0)
MCV: 88.3 fL (ref 78.0–100.0)
Platelets: 239 10*3/uL (ref 150–400)
RBC: 3.32 MIL/uL — ABNORMAL LOW (ref 3.87–5.11)
RDW: 15.1 % (ref 11.5–15.5)
WBC: 6.9 10*3/uL (ref 4.0–10.5)

## 2016-02-20 LAB — URINALYSIS, ROUTINE W REFLEX MICROSCOPIC
Bilirubin Urine: NEGATIVE
GLUCOSE, UA: NEGATIVE mg/dL
Nitrite: POSITIVE — AB
PH: 5 (ref 5.0–8.0)
Protein, ur: >300 mg/dL — AB
SPECIFIC GRAVITY, URINE: 1.025 (ref 1.005–1.030)

## 2016-02-20 LAB — URINE MICROSCOPIC-ADD ON: Squamous Epithelial / LPF: NONE SEEN

## 2016-02-20 MED ORDER — MEGESTROL ACETATE 40 MG PO TABS: ORAL_TABLET | ORAL | Status: DC

## 2016-02-20 NOTE — MAU Note (Signed)
Have had to periods this month. Started bleeding this past Monday for second time. Diagnosed last yr with endometriosis. Just had annual exam last Friday. Dark circles around eyes. Some nausea. Used about 7 pads today and they were soaked. Some clots

## 2016-02-20 NOTE — Discharge Instructions (Signed)

## 2016-02-20 NOTE — Progress Notes (Signed)
Written and verbal d/c instructions given and understanding voiced. 

## 2016-02-20 NOTE — MAU Provider Note (Signed)
Chief Complaint: Vaginal Bleeding   First Provider Initiated Contact with Patient 02/20/16 2033      SUBJECTIVE HPI: Andrea Long is a 50 y.o. G3P1011 who presents to maternity admissions reporting onset of heavy menstrual bleeding this morning.  She reports her LMP was 2 weeks ago but was not this heavy. Nothing makes the bleeding better or worse.  She had episodes of heavy vaginal bleeding last year and was treated with Megace, which helped.  She sees Dr Sandford Craze and has discussed surgical options to treat bleeding but has not desired surgery before now.  She denies dizziness, h/a, chest pain or palpitations but does reports some off and on nausea today associated with the bleeding and reports her eyes are dark underneath and more sunken than usual.  She denies STD risks. She denies vaginal itching/burning, urinary symptoms, h/a, dizziness, n/v, or fever/chills.     HPI  Past Medical History  Diagnosis Date  . Dysmenorrhea   . Endometriosis 2016  . Plantar fasciitis of right foot    Past Surgical History  Procedure Laterality Date  . Cesarean section     Social History   Social History  . Marital Status: Married    Spouse Name: N/A  . Number of Children: N/A  . Years of Education: N/A   Occupational History  . Not on file.   Social History Main Topics  . Smoking status: Never Smoker   . Smokeless tobacco: Never Used  . Alcohol Use: No  . Drug Use: No  . Sexual Activity: Yes    Birth Control/ Protection: Surgical   Other Topics Concern  . Not on file   Social History Narrative   No current facility-administered medications on file prior to encounter.   No current outpatient prescriptions on file prior to encounter.   No Known Allergies  ROS:  Review of Systems  Constitutional: Negative for fatigue.  Respiratory: Negative for shortness of breath.   Cardiovascular: Negative for chest pain and palpitations.  Genitourinary: Positive for vaginal  bleeding and pelvic pain. Negative for difficulty urinating and vaginal pain.  Neurological: Negative for dizziness.  Psychiatric/Behavioral: Negative.      I have reviewed patient's Past Medical Hx, Surgical Hx, Family Hx, Social Hx, medications and allergies.   Physical Exam   Patient Vitals for the past 24 hrs:  BP Temp Pulse Resp SpO2 Height Weight  02/20/16 2109 118/72 mmHg - 67 18 - - -  02/20/16 1942 154/78 mmHg 98.3 F (36.8 C) 70 18 100 % 5\' 1"  (1.549 m) 159 lb 6.4 oz (72.303 kg)   Constitutional: Well-developed, well-nourished female in no acute distress.  Cardiovascular: normal rate Respiratory: normal effort GI: Abd soft, non-tender. Pos BS x 4 MS: Extremities nontender, no edema, normal ROM Neurologic: Alert and oriented x 4.  GU: Neg CVAT.  PELVIC EXAM: Cervix pink, visually closed, without lesion, moderate amount dark red bleeding without clots, vaginal walls and external genitalia normal Bimanual exam: Cervix 0/long/high, firm, anterior, neg CMT, uterus nontender, nonenlarged, adnexa without tenderness, enlargement, or mass   LAB RESULTS Results for orders placed or performed during the hospital encounter of 02/20/16 (from the past 24 hour(s))  Urinalysis, Routine w reflex microscopic (not at [facility])     Status: Abnormal   Collection Time: 02/20/16  7:50 PM  Result Value Ref Range   Color, Urine RED (A) YELLOW   APPearance CLOUDY (A) CLEAR   Specific Gravity, Urine 1.025 1.005 - 1.030   pH 5.0  5.0 - 8.0   Glucose, UA NEGATIVE NEGATIVE mg/dL   Hgb urine dipstick LARGE (A) NEGATIVE   Bilirubin Urine NEGATIVE NEGATIVE   Ketones, ur TRACE (A) NEGATIVE mg/dL   Protein, ur >300 (A) NEGATIVE mg/dL   Nitrite POSITIVE (A) NEGATIVE   Leukocytes, UA SMALL (A) NEGATIVE  Urine microscopic-add on     Status: Abnormal   Collection Time: 02/20/16  7:50 PM  Result Value Ref Range   Squamous Epithelial / LPF NONE SEEN NONE SEEN   WBC, UA 0-5 0 - 5 WBC/hpf   RBC / HPF TOO  NUMEROUS TO COUNT 0 - 5 RBC/hpf   Bacteria, UA RARE (A) NONE SEEN   Urine-Other MUCOUS PRESENT   CBC     Status: Abnormal   Collection Time: 02/20/16  8:10 PM  Result Value Ref Range   WBC 6.9 4.0 - 10.5 K/uL   RBC 3.32 (L) 3.87 - 5.11 MIL/uL   Hemoglobin 9.9 (L) 12.0 - 15.0 g/dL   HCT 29.3 (L) 36.0 - 46.0 %   MCV 88.3 78.0 - 100.0 fL   MCH 29.8 26.0 - 34.0 pg   MCHC 33.8 30.0 - 36.0 g/dL   RDW 15.1 11.5 - 15.5 %   Platelets 239 150 - 400 K/uL       IMAGING No results found.  MAU Management/MDM: Ordered labs and reviewed results.  Pt anemic but not symptomatic so stable at this time.  Consult Dr Willis Modena.  Will treat with Megace taper, pt to pick up Rx and start tonight.  Also pt to take OTC iron supplement daily.  Bleeding precautions reviewed.  Pt stable at time of discharge.  ASSESSMENT 1. Abnormal uterine bleeding (AUB)     PLAN Discharge home with bleeding precautions Megace 40 mg TID x 3 days, BID x 3 days, then daily    Medication List    TAKE these medications        ibuprofen 200 MG tablet  Commonly known as:  ADVIL,MOTRIN  Take 600 mg by mouth every 6 (six) hours as needed for cramping.     megestrol 40 MG tablet  Commonly known as:  MEGACE  Take 3 tablets daily for 3 days, 2 tablets daily for 3 days, then 1 tablet daily.     vitamin C 1000 MG tablet  Take 1,000 mg by mouth daily.       Follow-up Information    Follow up with Bovard-Stuckert, Jeral Fruit, MD In 1 week.   Specialty:  Obstetrics and Gynecology   Why:  Return to MAU as needed for emergencies   Contact information:   [address] [place] [address] [phone]       Fatima Blank Certified Nurse-Midwife 02/20/2016  9:36 PM

## 2016-02-22 LAB — URINE CULTURE

## 2016-03-22 ENCOUNTER — Other Ambulatory Visit: Payer: Self-pay | Admitting: Advanced Practice Midwife

## 2016-03-23 ENCOUNTER — Other Ambulatory Visit: Payer: Self-pay | Admitting: Advanced Practice Midwife

## 2016-03-29 ENCOUNTER — Other Ambulatory Visit: Payer: Self-pay | Admitting: Advanced Practice Midwife

## 2016-03-29 MED FILL — MEGESTROL 40 MG TABLET: 40 | 30 days supply | Qty: 40 | Fill #0

## 2016-05-16 ENCOUNTER — Encounter (HOSPITAL_COMMUNITY): Payer: Self-pay

## 2016-05-16 ENCOUNTER — Inpatient Hospital Stay (HOSPITAL_COMMUNITY)
Admission: AD | Admit: 2016-05-16 | Discharge: 2016-05-16 | Disposition: A | Discharge status: Home / Self Care | Payer: BLUE CROSS/BLUE SHIELD | Source: Ambulatory Visit | Attending: Obstetrics and Gynecology | Admitting: Obstetrics and Gynecology

## 2016-05-16 ENCOUNTER — Inpatient Hospital Stay (HOSPITAL_COMMUNITY): Payer: BLUE CROSS/BLUE SHIELD

## 2016-05-16 DIAGNOSIS — R109 Unspecified abdominal pain: Secondary | ICD-10-CM

## 2016-05-16 DIAGNOSIS — Z975 Presence of (intrauterine) contraceptive device: Secondary | ICD-10-CM | POA: Diagnosis not present

## 2016-05-16 DIAGNOSIS — R1030 Lower abdominal pain, unspecified: Secondary | ICD-10-CM | POA: Diagnosis present

## 2016-05-16 DIAGNOSIS — D259 Leiomyoma of uterus, unspecified: Secondary | ICD-10-CM | POA: Diagnosis not present

## 2016-05-16 DIAGNOSIS — R102 Pelvic and perineal pain: Secondary | ICD-10-CM

## 2016-05-16 LAB — CBC
HEMATOCRIT: 32.2 % — AB (ref 36.0–46.0)
Hemoglobin: 10.4 g/dL — ABNORMAL LOW (ref 12.0–15.0)
MCH: 27.7 pg (ref 26.0–34.0)
MCHC: 32.3 g/dL (ref 30.0–36.0)
MCV: 85.9 fL (ref 78.0–100.0)
Platelets: 321 10*3/uL (ref 150–400)
RBC: 3.75 MIL/uL — ABNORMAL LOW (ref 3.87–5.11)
RDW: 18.7 % — AB (ref 11.5–15.5)
WBC: 5.2 10*3/uL (ref 4.0–10.5)

## 2016-05-16 LAB — POCT PREGNANCY, URINE: PREG TEST UR: NEGATIVE

## 2016-05-16 MED ORDER — OXYCODONE-ACETAMINOPHEN 5-325 MG PO TABS: 1.0000 | ORAL_TABLET | Freq: Four times a day (QID) | ORAL | Status: DC | PRN

## 2016-05-16 NOTE — MAU Note (Signed)
Pt presents complaining of lower abdominal cramping and spotting since her D&C on 6/20. They also put in an IUD to stop bleeding. Pt states the bleeding has not stopped and the cramping is worse than before. Tried ibuprofen but it does not help.

## 2016-05-16 NOTE — Discharge Instructions (Signed)

## 2016-05-16 NOTE — MAU Provider Note (Signed)
History     CSN: [id number]  Arrival date and time: 05/16/16 [id number]   First Provider Initiated Contact with Patient 05/16/16 1927      Chief Complaint  Patient presents with  . Abdominal Pain   HPI Andrea Long 50 y.o.  Comes in with lower abdominal pain that radiates to her back and to her right leg.  History of sonohistogram with D&C (done by Dr. Melba Coon) in past 2 weeks as she was having heavy bleeding.  With the [facility] she had a Mirena IUD inserted also.  Has had increased cramping - more than before the D&C.  Is taking 800 mg of Ibuprofen every 8 hours today and has not had any relief from the cramping.  Is hurting more than before her procedure.  Also have vaginal bleeding but not a large amount.  OB History    Gravida Para Term Preterm AB TAB SAB Ectopic Multiple Living   3 2 1  1  1   1       Past Medical History  Diagnosis Date  . Dysmenorrhea   . Endometriosis 2016  . Plantar fasciitis of right foot     Past Surgical History  Procedure Laterality Date  . Cesarean section    . Dilation and curettage of uterus      Family History  Problem Relation Age of Onset  . Diabetes Mother     Social History  Substance Use Topics  . Smoking status: Never Smoker   . Smokeless tobacco: Never Used  . Alcohol Use: No    Allergies: No Known Allergies  Prescriptions prior to admission  Medication Sig Dispense Refill Last Dose  . Ascorbic Acid (VITAMIN C) 1000 MG tablet Take 1,000 mg by mouth daily.   02/20/2016 at Unknown time  . ibuprofen (ADVIL,MOTRIN) 200 MG tablet Take 600 mg by mouth every 6 (six) hours as needed for cramping.   02/20/2016 at Unknown time  . megestrol (MEGACE) 40 MG tablet Take 3 tablets daily for 3 days, 2 tablets daily for 3 days, then 1 tablet daily. 40 tablet 0     Review of Systems  Gastrointestinal: Positive for abdominal pain. Negative for nausea and vomiting.  Genitourinary:       No vaginal discharge. Vaginal bleeding. No dysuria.   Musculoskeletal: Positive for back pain.   Physical Exam   Blood pressure 140/73, pulse 69, temperature 98.5 F (36.9 C), temperature source Oral, resp. rate 18.  Physical Exam  Nursing note and vitals reviewed. Constitutional: She is oriented to person, place, and time. She appears well-developed and well-nourished.  HENT:  Head: Normocephalic.  Eyes: EOM are normal.  Neck: Neck supple.  Respiratory: Effort normal.  GI: Soft. There is no tenderness.  Tender across lower abdomen with it being worse in low midline.  Genitourinary:  Speculum exam: Vagina - Small amount of dark blood Cervix - Small amount of active bleeding, small cervical os and IUD strings seen Bimanual exam: Cervix closed, IUD strings palpated.  No part of IUD palpated. Uterus ender with exam, normal size Adnexa non tender, no masses bilaterally GC/Chlam, wet prep done Chaperone present for exam.  Musculoskeletal: Normal range of motion.  Neurological: She is alert and oriented to person, place, and time.  Skin: Skin is warm and dry.  Psychiatric: She has a normal mood and affect.   Results for orders placed or performed during the hospital encounter of 05/16/16 (from the past 24 hour(s))  CBC  Status: Abnormal   Collection Time: 05/16/16  7:42 PM  Result Value Ref Range   WBC 5.2 4.0 - 10.5 K/uL   RBC 3.75 (L) 3.87 - 5.11 MIL/uL   Hemoglobin 10.4 (L) 12.0 - 15.0 g/dL   HCT 32.2 (L) 36.0 - 46.0 %   MCV 85.9 78.0 - 100.0 fL   MCH 27.7 26.0 - 34.0 pg   MCHC 32.3 30.0 - 36.0 g/dL   RDW 18.7 (H) 11.5 - 15.5 %   Platelets 321 150 - 400 K/uL  Pregnancy, urine POC     Status: None   Collection Time: 05/16/16  8:41 PM  Result Value Ref Range   Preg Test, Ur NEGATIVE NEGATIVE   MAU Course  Procedures  MDM Consult with Dr. Terri Piedra and discussed plan of care.  Will get CBC and Ultrasound.  2000  Care assumed by Tomi Bamberger, PA Earlie Server 05/16/2016, 7:27 PM   2000 - Care assumed from Earlie Server, NP. Patient in [place].   US Transvaginal Non-ob  05/16/2016  CLINICAL DATA:  50 year old female with recent D and Celsius and IUD insertion. Increased pelvic pain and bleeding EXAM: TRANSABDOMINAL AND TRANSVAGINAL ULTRASOUND OF PELVIS TECHNIQUE: Both transabdominal and transvaginal ultrasound examinations of the pelvis were performed. Transabdominal technique was performed for global imaging of the pelvis including uterus, ovaries, adnexal regions, and pelvic cul-de-sac. It was necessary to proceed with endovaginal exam following the transabdominal exam to visualize the endometrium and the ovaries. COMPARISON:  None FINDINGS: Uterus The uterus is anteverted and appears heterogeneous. The uterus is enlarged and measures 11.3 x 7.8 x 8.6 cm. There is a 3.4 x 3.3 x 3.3 cm posterior lower fibroid. An ill-defined area of heterogeneity in the uterine fundus is not well evaluated but may represent a fundal fibroid. Endometrium The endometrium is not well visualized. An intrauterine device is noted which appears to be positioned in the mid to lower uterus. The inferior tip of the IUD appears to be close to the level of the internal os. Right ovary Measurements: Not visualized. Left ovary Measurements: Not visualized. Other findings No abnormal free fluid. IMPRESSION: Enlarged heterogeneous uterus with a posterior lower uterine fibroid. Ill-defined heterogeneity at the uterine fundus may represent a fibroid. Intrauterine device appears in the mid to lower uterus. Nonvisualization of the ovaries. Electronically Signed   By: Anner Crete M.D.   On: 05/16/2016 20:42   US Pelvis Complete  05/16/2016  CLINICAL DATA:  50 year old female with recent D and Celsius and IUD insertion. Increased pelvic pain and bleeding EXAM: TRANSABDOMINAL AND TRANSVAGINAL ULTRASOUND OF PELVIS TECHNIQUE: Both transabdominal and transvaginal ultrasound examinations of the pelvis were performed. Transabdominal technique was performed for  global imaging of the pelvis including uterus, ovaries, adnexal regions, and pelvic cul-de-sac. It was necessary to proceed with endovaginal exam following the transabdominal exam to visualize the endometrium and the ovaries. COMPARISON:  None FINDINGS: Uterus The uterus is anteverted and appears heterogeneous. The uterus is enlarged and measures 11.3 x 7.8 x 8.6 cm. There is a 3.4 x 3.3 x 3.3 cm posterior lower fibroid. An ill-defined area of heterogeneity in the uterine fundus is not well evaluated but may represent a fundal fibroid. Endometrium The endometrium is not well visualized. An intrauterine device is noted which appears to be positioned in the mid to lower uterus. The inferior tip of the IUD appears to be close to the level of the internal os. Right ovary Measurements: Not visualized. Left ovary Measurements: Not visualized. Other  findings No abnormal free fluid. IMPRESSION: Enlarged heterogeneous uterus with a posterior lower uterine fibroid. Ill-defined heterogeneity at the uterine fundus may represent a fibroid. Intrauterine device appears in the mid to lower uterus. Nonvisualization of the ovaries. Electronically Signed   By: Anner Crete M.D.   On: 05/16/2016 20:42   Discussed patient update and [place] results with Dr. Terri Piedra. OK for discharge at this time. Follow-up with Dr. Melba Coon this week to discuss options. Rx for Percocet for pain and continue Ibuprofen.  Assessment and Plan  A: Pelvic pain IUD check up S/p hysteroscopy/D&C  P: Discharge home Rx for Percocet given to patient  Advised continue Ibuprofen as well Warning signs for worsening condition discussed Patient advised to follow-up with Dr. Melba Coon this week, call the office Monday morning for an appointment. Dr. Terri Piedra will forward information to Dr. Melba Coon for review.  Patient may return to MAU as needed or if her condition were to change or worsen   Luvenia Redden, PA-C  05/16/2016 8:57 PM

## 2016-07-06 ENCOUNTER — Encounter: Payer: Self-pay | Admitting: *Deleted

## 2016-07-20 ENCOUNTER — Encounter: Payer: BLUE CROSS/BLUE SHIELD | Admitting: Internal Medicine

## 2016-07-30 ENCOUNTER — Encounter: Payer: Self-pay | Admitting: Internal Medicine

## 2016-07-30 ENCOUNTER — Ambulatory Visit (INDEPENDENT_AMBULATORY_CARE_PROVIDER_SITE_OTHER): Payer: BLUE CROSS/BLUE SHIELD | Admitting: Internal Medicine

## 2016-07-30 VITALS — BP 120/71 | HR 72 | Temp 98.2°F | Ht 60.0 in | Wt 165.7 lb

## 2016-07-30 DIAGNOSIS — N958 Other specified menopausal and perimenopausal disorders: Secondary | ICD-10-CM

## 2016-07-30 DIAGNOSIS — Z1211 Encounter for screening for malignant neoplasm of colon: Secondary | ICD-10-CM

## 2016-07-30 DIAGNOSIS — Z23 Encounter for immunization: Secondary | ICD-10-CM | POA: Diagnosis not present

## 2016-07-30 NOTE — Assessment & Plan Note (Signed)
She is following up with her gynecologist and states that her periods are normal now.

## 2016-07-30 NOTE — Progress Notes (Signed)
   CC: For yearly physical without any complaints.  HPI:  [name] is a 50 y.o. is our healthy lady without any significant past medical history, came to the clinic for her annual physical. She has no complaints today. For her menorrhagia she is being following up with her gynecologist and states that her periods are becoming normal now. She had an appointment coming up with her gynecologist for Pap smear and mammogram in November 2017. Her lipid panel from last year was normal and she do not have any risk factors, we will repeat after 3 years. We gave her flu shot today and sent her for screening colonoscopy.  Past Medical History:  Diagnosis Date  . Dysmenorrhea   . Endometriosis 2016  . Plantar fasciitis of right foot     Review of Systems:  As per HPI.  Physical Exam:  Vitals:   07/30/16 1457  BP: 120/71  Pulse: 72  Temp: 98.2 F (36.8 C)  TempSrc: Oral  SpO2: 100%  Weight: 165 lb 11.2 oz (75.2 kg)  Height: 5' (1.524 m)   Vitals:   07/30/16 1457  BP: 120/71  Pulse: 72  Temp: 98.2 F (36.8 C)  TempSrc: Oral  SpO2: 100%  Weight: 165 lb 11.2 oz (75.2 kg)  Height: 5' (1.524 m)   General: Vital signs reviewed.  Patient is well-developed and well-nourished, in no acute distress and cooperative with exam.  Head: Normocephalic and atraumatic. Eyes: EOMI, conjunctivae normal, no scleral icterus.  Neck: Supple, trachea midline, normal ROM, no JVD, masses, thyromegaly, or carotid bruit present.  Cardiovascular: RRR, S1 normal, S2 normal, no murmurs, gallops, or rubs. Pulmonary/Chest: Clear to auscultation bilaterally, no wheezes, rales, or rhonchi. Abdominal: Soft, non-tender, non-distended, BS +, no masses, organomegaly, or guarding present.  Musculoskeletal: No joint deformities, erythema, or stiffness, ROM full and nontender. Extremities: No lower extremity edema bilaterally,  pulses symmetric and intact bilaterally. No cyanosis or clubbing. Neurological:  A&O x3, Strength is normal and symmetric bilaterally, cranial nerve II-XII are grossly intact, no focal motor deficit, sensory intact to light touch bilaterally.  Skin: Warm, dry and intact. No rashes or erythema. Psychiatric: Normal mood and affect. speech and behavior is normal. Cognition and memory are normal.   Assessment & Plan:   See Encounters Tab for problem based charting.  Patient seen with Dr. Daryll Drown.

## 2016-07-30 NOTE — Patient Instructions (Addendum)
It was pleasure taking care of few today. Please keep up the good work, eat healthy and exercise regularly. You are being given flu shot today. I am giving you a referral for your screening colonoscopy. Please go for your appointment for mammogram in November. We will let your gynecologist to take care about your annual Pap smear.

## 2016-07-30 NOTE — Assessment & Plan Note (Signed)
She has no complaints today. For her menorrhagia she is being following up with her gynecologist and states that her periods are becoming normal now. She has a normal physical exam today. She had an appointment coming up with her gynecologist for Pap smear and mammogram in November 2017. Her lipid panel from last year was normal and she do not have any risk factors, we will repeat after 3 years. We gave her flu shot today and sent her for screening colonoscopy.

## 2016-08-02 NOTE — Progress Notes (Signed)
Internal Medicine Clinic Attending  I saw and evaluated the patient.  I personally confirmed the key portions of the history and exam documented by Dr. Amin and I reviewed pertinent patient test results.  The assessment, diagnosis, and plan were formulated together and I agree with the documentation in the resident's note. 

## 2016-08-18 ENCOUNTER — Other Ambulatory Visit: Payer: Self-pay

## 2016-10-07 ENCOUNTER — Encounter: Payer: Self-pay | Admitting: Internal Medicine

## 2017-01-21 ENCOUNTER — Ambulatory Visit (HOSPITAL_COMMUNITY)
Admission: EM | Admit: 2017-01-21 | Discharge: 2017-01-21 | Disposition: A | Discharge status: Home / Self Care | Payer: BLUE CROSS/BLUE SHIELD | Attending: Family Medicine | Admitting: Family Medicine

## 2017-01-21 ENCOUNTER — Encounter (HOSPITAL_COMMUNITY): Payer: Self-pay | Admitting: Emergency Medicine

## 2017-01-21 DIAGNOSIS — J40 Bronchitis, not specified as acute or chronic: Secondary | ICD-10-CM

## 2017-01-21 DIAGNOSIS — J00 Acute nasopharyngitis [common cold]: Secondary | ICD-10-CM

## 2017-01-21 MED ORDER — AZITHROMYCIN 250 MG PO TABS: 250.0000 mg | ORAL_TABLET | Freq: Every day | ORAL | 0 refills | Status: DC

## 2017-01-21 MED ORDER — BENZONATATE 100 MG PO CAPS: 100.0000 mg | ORAL_CAPSULE | Freq: Three times a day (TID) | ORAL | 0 refills | Status: DC

## 2017-01-21 MED ORDER — IPRATROPIUM BROMIDE 0.06 % NA SOLN: 2.0000 | Freq: Four times a day (QID) | NASAL | 0 refills | Status: DC

## 2017-01-21 NOTE — ED Provider Notes (Signed)
CSN: [id number]     Arrival date & time 01/21/17  1259 History   None    Chief Complaint  Patient presents with  . URI   (Consider location/radiation/quality/duration/timing/severity/associated sxs/prior Treatment) Patient c/o cough and uri sx's for a week.   The history is provided by the patient.  URI  Presenting symptoms: congestion, cough, fatigue and rhinorrhea   Severity:  Moderate Onset quality:  Sudden Duration:  1 week Timing:  Constant Chronicity:  New Relieved by:  None tried Worsened by:  Nothing Ineffective treatments:  None tried   Past Medical History:  Diagnosis Date  . Dysmenorrhea   . Endometriosis 2016  . Plantar fasciitis of right foot    Past Surgical History:  Procedure Laterality Date  . CESAREAN SECTION    . DILATION AND CURETTAGE OF UTERUS     Family History  Problem Relation Age of Onset  . Diabetes Mother    Social History  Substance Use Topics  . Smoking status: Never Smoker  . Smokeless tobacco: Never Used  . Alcohol use No   OB History    Gravida Para Term Preterm AB Living   3 2 1   1 1    SAB TAB Ectopic Multiple Live Births   1       2     Review of Systems  Constitutional: Positive for fatigue.  HENT: Positive for congestion and rhinorrhea.   Eyes: Negative.   Respiratory: Positive for cough.   Cardiovascular: Negative.   Gastrointestinal: Negative.   Endocrine: Negative.   Genitourinary: Negative.   Musculoskeletal: Negative.   Allergic/Immunologic: Negative.   Neurological: Negative.   Hematological: Negative.   Psychiatric/Behavioral: Negative.     Allergies  Patient has no known allergies.  Home Medications   Prior to Admission medications   Medication Sig Start Date End Date Taking? Authorizing Provider  Ascorbic Acid (VITAMIN C) 1000 MG tablet Take 1,000 mg by mouth daily.    Historical Provider, MD  azithromycin (ZITHROMAX) 250 MG tablet Take 1 tablet (250 mg total) by mouth daily. Take first 2 tablets  together, then 1 every day until finished. 01/21/17   Lysbeth Penner, FNP  benzonatate (TESSALON) 100 MG capsule Take 1 capsule (100 mg total) by mouth every 8 (eight) hours. 01/21/17   Lysbeth Penner, FNP  ibuprofen (ADVIL,MOTRIN) 200 MG tablet Take 600 mg by mouth every 6 (six) hours as needed for cramping.    Historical Provider, MD  ipratropium (ATROVENT) 0.06 % nasal spray Place 2 sprays into both nostrils 4 (four) times daily. 01/21/17   Lysbeth Penner, FNP  megestrol (MEGACE) 40 MG tablet Take 3 tablets daily for 3 days, 2 tablets daily for 3 days, then 1 tablet daily. 02/20/16   Kathie Dike Leftwich-Kirby, CNM  oxyCODONE-acetaminophen (PERCOCET/ROXICET) 5-325 MG tablet Take 1-2 tablets by mouth every 6 (six) hours as needed for severe pain. 05/16/16   Luvenia Redden, PA-C   Meds Ordered and Administered this Visit  Medications - No data to display  BP 159/92 (BP Location: Left Arm)   Pulse 68   Temp 98.3 F (36.8 C) (Oral)   Resp 16   SpO2 100%  No data found.   Physical Exam  Constitutional: She is oriented to person, place, and time. She appears well-developed and well-nourished.  HENT:  Head: Normocephalic and atraumatic.  Right Ear: External ear normal.  Left Ear: External ear normal.  Mouth/Throat: Oropharynx is clear and moist.  Eyes: Conjunctivae and  EOM are normal. Pupils are equal, round, and reactive to light.  Neck: Normal range of motion. Neck supple.  Cardiovascular: Normal rate, regular rhythm and normal heart sounds.   Pulmonary/Chest: Effort normal and breath sounds normal.  Abdominal: Soft. Bowel sounds are normal.  Musculoskeletal: Normal range of motion.  Neurological: She is alert and oriented to person, place, and time.  Nursing note and vitals reviewed.   Urgent Care Course     Procedures (including critical care time)  Labs Review Labs Reviewed - No data to display  Imaging Review No results found.   Visual Acuity Review  Right Eye Distance:    Left Eye Distance:   Bilateral Distance:    Right Eye Near:   Left Eye Near:    Bilateral Near:         MDM   1. Acute nasopharyngitis   2. Bronchitis    zpak Tessalon perles atrovent nasal spray  Push po fluids, rest, tylenol and motrin otc prn as directed for fever, arthralgias, and myalgias.  Follow up prn if sx's continue or persist.    Lysbeth Penner, FNP 01/21/17 1359

## 2017-01-21 NOTE — ED Triage Notes (Signed)
Pt c/o cold sx onset: 5 days  Sx include: BA, ST, fatigue, dry cough, laryngitis  Denies: fevers  Taking: OTC cold meds w/temp relief.   A&O x4... NAD

## 2017-08-12 ENCOUNTER — Encounter: Payer: Self-pay | Admitting: Internal Medicine

## 2017-08-12 ENCOUNTER — Ambulatory Visit (INDEPENDENT_AMBULATORY_CARE_PROVIDER_SITE_OTHER): Payer: BLUE CROSS/BLUE SHIELD | Admitting: Internal Medicine

## 2017-08-12 VITALS — BP 135/80 | HR 72 | Temp 98.1°F | Ht 60.0 in | Wt 165.6 lb

## 2017-08-12 DIAGNOSIS — Z0000 Encounter for general adult medical examination without abnormal findings: Secondary | ICD-10-CM

## 2017-08-12 DIAGNOSIS — N924 Excessive bleeding in the premenopausal period: Secondary | ICD-10-CM | POA: Diagnosis not present

## 2017-08-12 DIAGNOSIS — Z975 Presence of (intrauterine) contraceptive device: Secondary | ICD-10-CM

## 2017-08-12 DIAGNOSIS — M545 Low back pain: Secondary | ICD-10-CM

## 2017-08-12 DIAGNOSIS — G8929 Other chronic pain: Secondary | ICD-10-CM | POA: Diagnosis not present

## 2017-08-12 DIAGNOSIS — Z23 Encounter for immunization: Secondary | ICD-10-CM | POA: Diagnosis not present

## 2017-08-12 NOTE — Patient Instructions (Addendum)
Thank you for visiting clinic today. I'm glad you are doing well. For your occasional lower back you can do back strengthening exercises, you can also use Tylenol or ibuprofen along with some heating pad if needed. You can use calcium with vitamin D supplement daily. Exercises regularly and eat healthy.   Back Exercises If you have pain in your back, do these exercises 2-3 times each day or as told by your doctor. When the pain goes away, do the exercises once each day, but repeat the steps more times for each exercise (do more repetitions). If you do not have pain in your back, do these exercises once each day or as told by your doctor. Exercises Single Knee to Chest  Do these steps 3-5 times in a row for each leg: 1. Lie on your back on a firm bed or the floor with your legs stretched out. 2. Bring one knee to your chest. 3. Hold your knee to your chest by grabbing your knee or thigh. 4. Pull on your knee until you feel a gentle stretch in your lower back. 5. Keep doing the stretch for 10-30 seconds. 6. Slowly let go of your leg and straighten it.  Pelvic Tilt  Do these steps 5-10 times in a row: 1. Lie on your back on a firm bed or the floor with your legs stretched out. 2. Bend your knees so they point up to the ceiling. Your feet should be flat on the floor. 3. Tighten your lower belly (abdomen) muscles to press your lower back against the floor. This will make your tailbone point up to the ceiling instead of pointing down to your feet or the floor. 4. Stay in this position for 5-10 seconds while you gently tighten your muscles and breathe evenly.  Cat-Cow  Do these steps until your lower back bends more easily: 1. Get on your hands and knees on a firm surface. Keep your hands under your shoulders, and keep your knees under your hips. You may put padding under your knees. 2. Let your head hang down, and make your tailbone point down to the floor so your lower back is round like  the back of a cat. 3. Stay in this position for 5 seconds. 4. Slowly lift your head and make your tailbone point up to the ceiling so your back hangs low (sags) like the back of a cow. 5. Stay in this position for 5 seconds.  Press-Ups  Do these steps 5-10 times in a row: 1. Lie on your belly (face-down) on the floor. 2. Place your hands near your head, about shoulder-width apart. 3. While you keep your back relaxed and keep your hips on the floor, slowly straighten your arms to raise the top half of your body and lift your shoulders. Do not use your back muscles. To make yourself more comfortable, you may change where you place your hands. 4. Stay in this position for 5 seconds. 5. Slowly return to lying flat on the floor.  Bridges  Do these steps 10 times in a row: 1. Lie on your back on a firm surface. 2. Bend your knees so they point up to the ceiling. Your feet should be flat on the floor. 3. Tighten your butt muscles and lift your butt off of the floor until your waist is almost as high as your knees. If you do not feel the muscles working in your butt and the back of your thighs, slide your feet 1-2 inches  farther away from your butt. 4. Stay in this position for 3-5 seconds. 5. Slowly lower your butt to the floor, and let your butt muscles relax.  If this exercise is too easy, try doing it with your arms crossed over your chest. Belly Crunches  Do these steps 5-10 times in a row: 1. Lie on your back on a firm bed or the floor with your legs stretched out. 2. Bend your knees so they point up to the ceiling. Your feet should be flat on the floor. 3. Cross your arms over your chest. 4. Tip your chin a little bit toward your chest but do not bend your neck. 5. Tighten your belly muscles and slowly raise your chest just enough to lift your shoulder blades a tiny bit off of the floor. 6. Slowly lower your chest and your head to the floor.  Back Lifts Do these steps 5-10 times in  a row: 1. Lie on your belly (face-down) with your arms at your sides, and rest your forehead on the floor. 2. Tighten the muscles in your legs and your butt. 3. Slowly lift your chest off of the floor while you keep your hips on the floor. Keep the back of your head in line with the curve in your back. Look at the floor while you do this. 4. Stay in this position for 3-5 seconds. 5. Slowly lower your chest and your face to the floor.  Contact a doctor if:  Your back pain gets a lot worse when you do an exercise.  Your back pain does not lessen 2 hours after you exercise. If you have any of these problems, stop doing the exercises. Do not do them again unless your doctor says it is okay. Get help right away if:  You have sudden, very bad back pain. If this happens, stop doing the exercises. Do not do them again unless your doctor says it is okay. This information is not intended to replace advice given to you by your health care provider. Make sure you discuss any questions you have with your health care provider. Document Released: 12/11/2010 Document Revised: 04/15/2016 Document Reviewed: 01/02/2015 Elsevier Interactive Patient Education  [employer].

## 2017-08-12 NOTE — Progress Notes (Signed)
   CC: For follow-up of her back pain.  HPI:  [name] is a 51 y.o. with past medical history as listed below came to the clinic for her annual checkup and complaining of occasionally getting lower back pain after prolonged standing. No red flag symptoms. Pain relieved with resting or occasionally takes Tylenol or ibuprofen.  She was also asking about DEXA scan.  She follow-up with ups and GYN for her Pap and mammogram. Her next appointment with them is on 10/03/2017.  Past Medical History:  Diagnosis Date  . Dysmenorrhea   . Endometriosis 2016  . Plantar fasciitis of right foot    Review of Systems:  Negative except mentioned in history of present illness.  Physical Exam:  Vitals:   08/12/17 1405 08/12/17 1435  BP: (!) 143/75 135/80  Pulse: 72 72  Temp: 98.1 F (36.7 C)   TempSrc: Oral   SpO2: 99%   Weight: 165 lb 9.6 oz (75.1 kg)   Height: 5' (1.524 m)     General: Vital signs reviewed.  Patient is well-developed and well-nourished, in no acute distress and cooperative with exam.  Head: Normocephalic and atraumatic. Eyes: EOMI, conjunctivae normal, no scleral icterus.  Cardiovascular: RRR, S1 normal, S2 normal, no murmurs, gallops, or rubs. Pulmonary/Chest: Clear to auscultation bilaterally, no wheezes, rales, or rhonchi. Abdominal: Soft, non-tender, non-distended, BS +, no masses, organomegaly, or guarding present.  Musculoskeletal: No joint deformities, erythema, or stiffness, ROM full and nontender. Extremities: No lower extremity edema bilaterally,  pulses symmetric and intact bilaterally. No cyanosis or clubbing. Neurological: A&O x3, Strength is normal and symmetric bilaterally, cranial nerve II-XII are grossly intact, no focal motor deficit, sensory intact to light touch bilaterally.  Skin: Warm, dry and intact. No rashes or erythema. Psychiatric: Normal mood and affect. speech and behavior is normal. Cognition and memory are normal.  Assessment &  Plan:   See Encounters Tab for problem based charting.  Patient discussed with Dr. Evette Doffing.

## 2017-08-12 NOTE — Assessment & Plan Note (Signed)
Flu vaccine was provided.  She gets her mammogram and Pap smear from her gynecologist.  She was inquiring about DEXA scan, her Frax score was 3.3 for osteoporotic fracture and 0.1 for hip fracture.  We will do a  DEXA scan after she turns 60.

## 2017-08-12 NOTE — Assessment & Plan Note (Signed)
She follow-up with GYN. Currently using IUD which causes decreased in her menstrual flow. She denied any more excessive bleeding.

## 2017-08-12 NOTE — Assessment & Plan Note (Signed)
Her complaint of lower back pain which occurs very occasionally after prolonged standing was most likely a musculoskeletal. There was no red flag symptoms and her exam was completely benign.  She was advised to  Do back exercises to strengthen her back. She was advised to use Tylenol or ibuprofen if needed.

## 2017-08-15 NOTE — Progress Notes (Signed)
Internal Medicine Clinic Attending  Case discussed with Dr. Amin at the time of the visit.  We reviewed the resident's history and exam and pertinent patient test results.  I agree with the assessment, diagnosis, and plan of care documented in the resident's note.    

## 2018-03-29 ENCOUNTER — Other Ambulatory Visit: Payer: Self-pay

## 2018-03-29 ENCOUNTER — Ambulatory Visit (HOSPITAL_COMMUNITY)
Admission: EM | Admit: 2018-03-29 | Discharge: 2018-03-29 | Disposition: A | Discharge status: Home / Self Care | Payer: BLUE CROSS/BLUE SHIELD | Attending: Family Medicine | Admitting: Family Medicine

## 2018-03-29 ENCOUNTER — Encounter (HOSPITAL_COMMUNITY): Payer: Self-pay

## 2018-03-29 DIAGNOSIS — J309 Allergic rhinitis, unspecified: Secondary | ICD-10-CM

## 2018-03-29 DIAGNOSIS — J069 Acute upper respiratory infection, unspecified: Secondary | ICD-10-CM | POA: Diagnosis not present

## 2018-03-29 MED ORDER — IBUPROFEN 200 MG PO TABS: 600.0000 mg | ORAL_TABLET | Freq: Four times a day (QID) | ORAL | 0 refills | Status: DC | PRN

## 2018-03-29 MED ORDER — IPRATROPIUM BROMIDE 0.06 % NA SOLN: 2.0000 | Freq: Four times a day (QID) | NASAL | 0 refills | Status: DC

## 2018-03-29 MED ORDER — CETIRIZINE HCL 10 MG PO TABS: 10.0000 mg | ORAL_TABLET | Freq: Every day | ORAL | 0 refills | Status: AC

## 2018-03-29 MED ORDER — FLUTICASONE PROPIONATE 50 MCG/ACT NA SUSP: 1.0000 | Freq: Every day | NASAL | 2 refills | Status: DC

## 2018-03-29 NOTE — ED Provider Notes (Signed)
[place]    CSN: [id number] Arrival date & time: 03/29/18  1118     History   Chief Complaint Chief Complaint  Patient presents with  . URI    HPI Andrea Long is a 52 y.o. female.   Andrea Long presents with complaints of runny nose, productive cough, tearing eyes and fatigue which started 5 days ago. Just started using daily flonase as well as a daily allergy medication which has helped some. No fevers. Ibuprofen has also somewhat helped. Feels occasionally short of breath with congestion. Husband has ear issue but otherwise not other known ill contacts. States it is very dusty where she works and this seems to worsen symptoms. Denies gi/gu complaints. No history of asthma, does not smoke. Hx of seasonal allergies.    ROS per HPI.      Past Medical History:  Diagnosis Date  . Dysmenorrhea   . Endometriosis 2016  . Plantar fasciitis of right foot     Patient Active Problem List   Diagnosis Date Noted  . Chronic right-sided low back pain without sciatica 08/12/2017  . Health care maintenance 03/05/2015  . Seasonal allergies 03/05/2015  . Obesity (BMI 30.0-34.9) 03/05/2015  . Perimenopausal menorrhagia 12/06/2014    Past Surgical History:  Procedure Laterality Date  . CESAREAN SECTION    . DILATION AND CURETTAGE OF UTERUS      OB History    Gravida  3   Para  2   Term  1   Preterm      AB  1   Living  1     SAB  1   TAB      Ectopic      Multiple      Live Births  2            Home Medications    Prior to Admission medications   Medication Sig Start Date End Date Taking? Authorizing Provider  Ascorbic Acid (VITAMIN C) 1000 MG tablet Take 1,000 mg by mouth daily.   Yes [provider]  benzonatate (TESSALON) 100 MG capsule Take 1 capsule (100 mg total) by mouth every 8 (eight) hours. 01/21/17  Yes Lysbeth Penner, FNP  cetirizine (ZYRTEC) 10 MG tablet Take 1 tablet (10 mg total) by mouth daily. 03/29/18   Zigmund Gottron, NP  fluticasone (FLONASE) 50 MCG/ACT nasal spray Place 1 spray into both nostrils daily. 03/29/18   Zigmund Gottron, NP  ibuprofen (ADVIL,MOTRIN) 200 MG tablet Take 3 tablets (600 mg total) by mouth every 6 (six) hours as needed for cramping. 03/29/18   Augusto Gamble B, NP  ipratropium (ATROVENT) 0.06 % nasal spray Place 2 sprays into both nostrils 4 (four) times daily. 03/29/18   Zigmund Gottron, NP  megestrol (MEGACE) 40 MG tablet Take 3 tablets daily for 3 days, 2 tablets daily for 3 days, then 1 tablet daily. 02/20/16   Leftwich-Kirby, Kathie Dike, CNM  oxyCODONE-acetaminophen (PERCOCET/ROXICET) 5-325 MG tablet Take 1-2 tablets by mouth every 6 (six) hours as needed for severe pain. 05/16/16   Luvenia Redden, PA-C    Family History Family History  Problem Relation Age of Onset  . Diabetes Mother     Social History Social History   Tobacco Use  . Smoking status: Never Smoker  . Smokeless tobacco: Never Used  Substance Use Topics  . Alcohol use: No  . Drug use: No     Allergies   Patient has no known  allergies.   Review of Systems Review of Systems   Physical Exam Triage Vital Signs ED Triage Vitals [03/29/18 1139]  Enc Vitals Group     BP (!) 141/82     Pulse Rate 65     Resp 18     Temp 98.5 F (36.9 C)     Temp src      SpO2 95 %     Weight 165 lb (74.8 kg)     Height      Head Circumference      Peak Flow      Pain Score 0     Pain Loc      Pain Edu?      Excl. in [place]?    No data found.  Updated Vital Signs BP (!) 141/82   Pulse 65   Temp 98.5 F (36.9 C)   Resp 18   Wt 165 lb (74.8 kg)   SpO2 95%   BMI 32.22 kg/m    Physical Exam  Constitutional: She is oriented to person, place, and time. She appears well-developed and well-nourished. No distress.  HENT:  Head: Normocephalic and atraumatic.  Right Ear: Tympanic membrane, external ear and ear canal normal.  Left Ear: Tympanic membrane, external ear and ear canal normal.  Nose: Mucosal  edema and rhinorrhea present. Right sinus exhibits no maxillary sinus tenderness and no frontal sinus tenderness. Left sinus exhibits no maxillary sinus tenderness and no frontal sinus tenderness.  Mouth/Throat: Uvula is midline, oropharynx is clear and moist and mucous membranes are normal. No tonsillar exudate.  Eyes: Pupils are equal, round, and reactive to light. Conjunctivae and EOM are normal. Right eye exhibits no discharge. Left eye exhibits no discharge.  Cardiovascular: Normal rate, regular rhythm and normal heart sounds.  Pulmonary/Chest: Effort normal and breath sounds normal.  Neurological: She is alert and oriented to person, place, and time.  Skin: Skin is warm and dry.     UC Treatments / Results  Labs (all labs ordered are listed, but only abnormal results are displayed) Labs Reviewed - No data to display  EKG None  Radiology No results found.  Procedures Procedures (including critical care time)  Medications Ordered in UC Medications - No data to display  Initial Impression / Assessment and Plan / UC Course  I have reviewed the triage vital signs and the nursing notes.  Pertinent labs & imaging results that were available during my care of the patient were reviewed by me and considered in my medical decision making (see chart for details).     Afebrile. Non toxic in appearance. Benign physical findings. Likely allergic in nature although may have viral component. Continue with supportive cares. Return precautions provided. Patient verbalized understanding and agreeable to plan.      Final Clinical Impressions(s) / UC Diagnoses   Final diagnoses:  Viral upper respiratory tract infection  Allergic rhinitis, unspecified seasonality, unspecified trigger     Discharge Instructions     Push fluids to ensure adequate hydration and keep secretions thin.  Tylenol and/or ibuprofen as needed for pain or fevers.  Daily flonase as well as use of nasal Atrovent  2-4 times a day as needed for congestion. Daily zyrtec. If symptoms worsen or do not improve in the next week to return to be seen or to follow up with your PCP.     ED Prescriptions    Medication Sig Dispense Auth. Provider   ibuprofen (ADVIL,MOTRIN) 200 MG tablet Take 3 tablets (600  mg total) by mouth every 6 (six) hours as needed for cramping. 30 tablet Augusto Gamble B, NP   ipratropium (ATROVENT) 0.06 % nasal spray Place 2 sprays into both nostrils 4 (four) times daily. 15 mL Augusto Gamble B, NP   fluticasone (FLONASE) 50 MCG/ACT nasal spray Place 1 spray into both nostrils daily. 16 g Augusto Gamble B, NP   cetirizine (ZYRTEC) 10 MG tablet Take 1 tablet (10 mg total) by mouth daily. 30 tablet Zigmund Gottron, NP     Controlled Substance Prescriptions [place] Controlled Substance Registry consulted? Not Applicable   Zigmund Gottron, NP 03/29/18 1224

## 2018-03-29 NOTE — Discharge Instructions (Signed)
Push fluids to ensure adequate hydration and keep secretions thin.  Tylenol and/or ibuprofen as needed for pain or fevers.  Daily flonase as well as use of nasal Atrovent 2-4 times a day as needed for congestion. Daily zyrtec. If symptoms worsen or do not improve in the next week to return to be seen or to follow up with your PCP.

## 2018-03-29 NOTE — ED Triage Notes (Signed)
Pt presents with complaints of upper respiratory symptoms x 5 days.

## 2018-05-27 ENCOUNTER — Other Ambulatory Visit: Payer: Self-pay

## 2018-05-27 ENCOUNTER — Encounter (HOSPITAL_COMMUNITY): Payer: Self-pay | Admitting: Emergency Medicine

## 2018-05-27 ENCOUNTER — Ambulatory Visit (HOSPITAL_COMMUNITY)
Admission: EM | Admit: 2018-05-27 | Discharge: 2018-05-27 | Disposition: A | Discharge status: Home / Self Care | Payer: BLUE CROSS/BLUE SHIELD | Attending: Family Medicine | Admitting: Family Medicine

## 2018-05-27 DIAGNOSIS — J020 Streptococcal pharyngitis: Secondary | ICD-10-CM

## 2018-05-27 LAB — POCT RAPID STREP A: STREPTOCOCCUS, GROUP A SCREEN (DIRECT): POSITIVE — AB

## 2018-05-27 MED ORDER — AMOXICILLIN 500 MG PO CAPS: 500.0000 mg | ORAL_CAPSULE | Freq: Two times a day (BID) | ORAL | 0 refills | Status: AC

## 2018-05-27 NOTE — ED Provider Notes (Signed)
[place]    CSN: [id number] Arrival date & time: 05/27/18  1343     History   Chief Complaint Chief Complaint  Patient presents with  . Sore Throat    HPI SHUNTAVIA Andrea Long is a 52 y.o. female.   52 year old female comes in for 1 day history of sore throat, body aches, subjective fever.  Denies rhinorrhea, nasal congestion, cough.  States will go up with some joint pain as well.  She has been taking Tylenol to help with the fever.  She has painful swallowing without trouble breathing, drooling, tripoding, trismus.  Denies conjunctival injection, abdominal pain, nausea, vomiting.  Denies tick bites.  Positive sick contact.  Never smoker.     Past Medical History:  Diagnosis Date  . Dysmenorrhea   . Endometriosis 2016  . Plantar fasciitis of right foot     Patient Active Problem List   Diagnosis Date Noted  . Chronic right-sided low back pain without sciatica 08/12/2017  . Health care maintenance 03/05/2015  . Seasonal allergies 03/05/2015  . Obesity (BMI 30.0-34.9) 03/05/2015  . Perimenopausal menorrhagia 12/06/2014    Past Surgical History:  Procedure Laterality Date  . CESAREAN SECTION    . DILATION AND CURETTAGE OF UTERUS      OB History    Gravida  3   Para  2   Term  1   Preterm      AB  1   Living  1     SAB  1   TAB      Ectopic      Multiple      Live Births  2            Home Medications    Prior to Admission medications   Medication Sig Start Date End Date Taking? Authorizing Provider  Pseudoephedrine-APAP-DM (DAYQUIL MULTI-SYMPTOM COLD/FLU PO) Take by mouth.   Yes [provider]  amoxicillin (AMOXIL) 500 MG capsule Take 1 capsule (500 mg total) by mouth 2 (two) times daily for 10 days. 05/27/18 06/06/18  Ok Edwards, PA-C  Ascorbic Acid (VITAMIN C) 1000 MG tablet Take 1,000 mg by mouth daily.    [provider]  benzonatate (TESSALON) 100 MG capsule Take 1 capsule (100 mg total) by mouth every 8  (eight) hours. 01/21/17   Lysbeth Penner, FNP  cetirizine (ZYRTEC) 10 MG tablet Take 1 tablet (10 mg total) by mouth daily. 03/29/18   Zigmund Gottron, NP  fluticasone (FLONASE) 50 MCG/ACT nasal spray Place 1 spray into both nostrils daily. 03/29/18   Zigmund Gottron, NP  ibuprofen (ADVIL,MOTRIN) 200 MG tablet Take 3 tablets (600 mg total) by mouth every 6 (six) hours as needed for cramping. 03/29/18   Augusto Gamble B, NP  ipratropium (ATROVENT) 0.06 % nasal spray Place 2 sprays into both nostrils 4 (four) times daily. 03/29/18   Zigmund Gottron, NP  megestrol (MEGACE) 40 MG tablet Take 3 tablets daily for 3 days, 2 tablets daily for 3 days, then 1 tablet daily. 02/20/16   Leftwich-Kirby, Kathie Dike, CNM  oxyCODONE-acetaminophen (PERCOCET/ROXICET) 5-325 MG tablet Take 1-2 tablets by mouth every 6 (six) hours as needed for severe pain. 05/16/16   Luvenia Redden, PA-C    Family History Family History  Problem Relation Age of Onset  . Diabetes Mother     Social History Social History   Tobacco Use  . Smoking status: Never Smoker  . Smokeless tobacco: Never Used  Substance  Use Topics  . Alcohol use: No  . Drug use: No     Allergies   Patient has no known allergies.   Review of Systems Review of Systems  Reason unable to perform ROS: See HPI as above.     Physical Exam Triage Vital Signs ED Triage Vitals [05/27/18 1410]  Enc Vitals Group     BP 125/66     Pulse Rate 71     Resp 18     Temp 98.6 F (37 C)     Temp Source Oral     SpO2 100 %     Weight      Height      Head Circumference      Peak Flow      Pain Score 10     Pain Loc      Pain Edu?      Excl. in [place]?    No data found.  Updated Vital Signs BP 125/66 (BP Location: Left Arm)   Pulse 71   Temp 98.6 F (37 C) (Oral)   Resp 18   SpO2 100%    Physical Exam  Constitutional: She is oriented to person, place, and time. She appears well-developed and well-nourished.  Non-toxic appearance. She does not appear  ill. No distress.  HENT:  Head: Normocephalic and atraumatic.  Right Ear: Tympanic membrane, external ear and ear canal normal. Tympanic membrane is not erythematous and not bulging.  Left Ear: Tympanic membrane, external ear and ear canal normal. Tympanic membrane is not erythematous and not bulging.  Nose: Nose normal. Right sinus exhibits no maxillary sinus tenderness and no frontal sinus tenderness. Left sinus exhibits no maxillary sinus tenderness and no frontal sinus tenderness.  Mouth/Throat: Uvula is midline and mucous membranes are normal. Posterior oropharyngeal erythema present. No tonsillar exudate.  Eyes: Pupils are equal, round, and reactive to light. Conjunctivae are normal.  Neck: Normal range of motion. Neck supple.  Cardiovascular: Normal rate, regular rhythm and normal heart sounds. Exam reveals no gallop and no friction rub.  No murmur heard. Pulmonary/Chest: Effort normal and breath sounds normal. She has no decreased breath sounds. She has no wheezes. She has no rhonchi. She has no rales.  Lymphadenopathy:    She has no cervical adenopathy.  Neurological: She is alert and oriented to person, place, and time.  Skin: Skin is warm and dry.  Psychiatric: She has a normal mood and affect. Her behavior is normal. Judgment normal.     UC Treatments / Results  Labs (all labs ordered are listed, but only abnormal results are displayed) Labs Reviewed  POCT RAPID STREP A - Abnormal; Notable for the following components:      Result Value   Streptococcus, Group A Screen (Direct) POSITIVE (*)    All other components within normal limits    EKG None  Radiology No results found.  Procedures Procedures (including critical care time)  Medications Ordered in UC Medications - No data to display  Initial Impression / Assessment and Plan / UC Course  I have reviewed the triage vital signs and the nursing notes.  Pertinent labs & imaging results that were available during  my care of the patient were reviewed by me and considered in my medical decision making (see chart for details).    Rapid strep positive. Start antibiotic as directed. Symptomatic treatment as needed. Return precautions given.   Final Clinical Impressions(s) / UC Diagnoses   Final diagnoses:  Strep pharyngitis  ED Prescriptions    Medication Sig Dispense Auth. Provider   amoxicillin (AMOXIL) 500 MG capsule Take 1 capsule (500 mg total) by mouth 2 (two) times daily for 10 days. 20 capsule Tobin Chad, [place] 05/27/18 [phone]

## 2018-05-27 NOTE — Discharge Instructions (Signed)
Rapid strep positive. Start amoxicillin as directed. Tylenol/Motrin for fever and pain. Monitor for any worsening of symptoms, trouble breathing, trouble swallowing, swelling of the throat, leaning forward to breath, drooling, follow up here or at the emergency department for reevaluation.

## 2018-05-27 NOTE — ED Triage Notes (Signed)
The patient presented to the [facility] with a complaint of a sore throat and general body aches that started yesterday. The patient denied any measured fever.

## 2018-06-19 ENCOUNTER — Encounter: Payer: Self-pay | Admitting: Internal Medicine

## 2018-08-14 ENCOUNTER — Other Ambulatory Visit: Payer: Self-pay

## 2018-08-14 ENCOUNTER — Encounter: Payer: Self-pay | Admitting: Internal Medicine

## 2018-08-14 ENCOUNTER — Ambulatory Visit (INDEPENDENT_AMBULATORY_CARE_PROVIDER_SITE_OTHER): Payer: BLUE CROSS/BLUE SHIELD | Admitting: Internal Medicine

## 2018-08-14 VITALS — BP 129/81 | HR 70 | Temp 98.0°F | Ht 61.0 in | Wt 165.2 lb

## 2018-08-14 DIAGNOSIS — Z0000 Encounter for general adult medical examination without abnormal findings: Secondary | ICD-10-CM

## 2018-08-14 DIAGNOSIS — N924 Excessive bleeding in the premenopausal period: Secondary | ICD-10-CM

## 2018-08-14 DIAGNOSIS — Z23 Encounter for immunization: Secondary | ICD-10-CM

## 2018-08-14 DIAGNOSIS — J302 Other seasonal allergic rhinitis: Secondary | ICD-10-CM | POA: Diagnosis not present

## 2018-08-14 MED ORDER — CALCIUM CARBONATE-VITAMIN D 500-200 MG-UNIT PO TABS: 1.0000 | ORAL_TABLET | Freq: Two times a day (BID) | ORAL | 3 refills | Status: AC

## 2018-08-14 NOTE — Assessment & Plan Note (Addendum)
Had discussion regarding continuation of healthy lifestyle which include eating healthy and exercising regularly. Encouraged patient to start taking calcium with vitamin D supplement as she is menopausal now. Blood pressure was mildly elevated on initial check which improved to 129/81 with a recheck. She was also provided with information regarding DASH diet. We will check her lipid profile as it was done 3 years ago. She was provided with flu shot today. Will try to get records about her mammogram and Pap smear from her gynecologist, according to patient they were normal.

## 2018-08-14 NOTE — Assessment & Plan Note (Signed)
Her allergic symptoms are well controlled with Zyrtec as needed. She does not require daily dose. She only takes it as needed.

## 2018-08-14 NOTE — Progress Notes (Signed)
   CC: For annual checkup.  HPI:  Andrea Long is a 52 y.o. with no significant past medical history came to the clinic for her yearly exam.  Patient has no complaints today. She tried to eat healthy and exercise regularly. She is using vitamin D supplement regularly. She has stopped menstruating now, her LMP was in October 2018. She had her mammogram and Pap done by her gynecologist during this past year but no records were found.  Past Medical History:  Diagnosis Date  . Dysmenorrhea   . Endometriosis 2016  . Plantar fasciitis of right foot    Review of Systems:   General: Denies fever, chills, fatigue, unexpected weight loss, change in appetite and diaphoresis.  Respiratory: Denies SOB, cough, DOE, chest tightness, and wheezing.   Cardiovascular: Denies chest pain and palpitations.  Gastrointestinal: Denies nausea, vomiting, abdominal pain, diarrhea, constipation, blood in stool and abdominal distention.  Genitourinary: Denies dysuria, urgency, frequency, hematuria, suprapubic pain and flank pain. Endocrine: Denies hot or cold intolerance, polyuria, and polydipsia. Musculoskeletal: Denies myalgias, back pain, joint swelling, arthralgias and gait problem.  Skin: Denies pallor, rash and wounds.  Neurological: Denies dizziness, headaches, weakness, lightheadedness, numbness, seizures, and syncope. Psychiatric/Behavioral: Denies mood changes, confusion, nervousness, sleep disturbance and agitation.  Physical Exam:  Vitals:   08/14/18 1422 08/14/18 1504  BP: 137/75 129/81  Pulse: 69 70  Temp: 98 F (36.7 C)   TempSrc: Oral   SpO2: 100%   Weight: 165 lb 3.2 oz (74.9 kg)   Height: 5\' 1"  (1.549 m)    General: Vital signs reviewed.  Patient is well-developed and well-nourished, in no acute distress and cooperative with exam.  Head: Normocephalic and atraumatic. Eyes: EOMI, conjunctivae normal, no scleral icterus.  Neck: Supple, trachea midline, normal ROM, no JVD,  masses, thyromegaly, or carotid bruit present.  Cardiovascular: RRR, S1 normal, S2 normal, no murmurs, gallops, or rubs. Pulmonary/Chest: Clear to auscultation bilaterally, no wheezes, rales, or rhonchi. Abdominal: Soft, non-tender, non-distended, BS +, no masses, organomegaly, or guarding present.  Extremities: No lower extremity edema bilaterally,  pulses symmetric and intact bilaterally. No cyanosis or clubbing. Neurological: A&O x3, Strength is normal and symmetric bilaterally, cranial nerve II-XII are grossly intact, no focal motor deficit, sensory intact to light touch bilaterally.  Skin: Warm, dry and intact. No rashes or erythema. Psychiatric: Normal mood and affect. speech and behavior is normal. Cognition and memory are normal.  Assessment & Plan:   See Encounters Tab for problem based charting.  Patient discussed with Dr. Dareen Piano.

## 2018-08-14 NOTE — Assessment & Plan Note (Signed)
Patient is menopausal now.  LMP was in October 2018. Denies any bleeding since then.  Patient has an history of anemia due to menorrhagia.  Will repeat CBC today.

## 2018-08-14 NOTE — Patient Instructions (Addendum)
Thank you for visiting clinic today. I am glad that you are doing very well, keep up the good work. Exercise regularly and eating healthy diet. As we discussed your blood pressure was mildly elevated today, I am not much concern at this point. Watch sodium content of your diet and try to eat low-sodium diet. I am also checking your cholesterol and blood counts today, will call you with any abnormal results. Follow-up in 1 year.   DASH Eating Plan DASH stands for "Dietary Approaches to Stop Hypertension." The DASH eating plan is a healthy eating plan that has been shown to reduce high blood pressure (hypertension). It may also reduce your risk for type 2 diabetes, heart disease, and stroke. The DASH eating plan may also help with weight loss. What are tips for following this plan? General guidelines  Avoid eating more than 2,300 mg (milligrams) of salt (sodium) a day. If you have hypertension, you may need to reduce your sodium intake to 1,500 mg a day.  Limit alcohol intake to no more than 1 drink a day for nonpregnant women and 2 drinks a day for men. One drink equals 12 oz of beer, 5 oz of wine, or 1 oz of hard liquor.  Work with your health care provider to maintain a healthy body weight or to lose weight. Ask what an ideal weight is for you.  Get at least 30 minutes of exercise that causes your heart to beat faster (aerobic exercise) most days of the week. Activities may include walking, swimming, or biking.  Work with your health care provider or diet and nutrition specialist (dietitian) to adjust your eating plan to your individual calorie needs. Reading food labels  Check food labels for the amount of sodium per serving. Choose foods with less than 5 percent of the Daily Value of sodium. Generally, foods with less than 300 mg of sodium per serving fit into this eating plan.  To find whole grains, look for the word "whole" as the first word in the ingredient list. Shopping  Buy  products labeled as "low-sodium" or "no salt added."  Buy fresh foods. Avoid canned foods and premade or frozen meals. Cooking  Avoid adding salt when cooking. Use salt-free seasonings or herbs instead of table salt or sea salt. Check with your health care provider or pharmacist before using salt substitutes.  Do not fry foods. Cook foods using healthy methods such as baking, boiling, grilling, and broiling instead.  Cook with heart-healthy oils, such as olive, canola, soybean, or sunflower oil. Meal planning   Eat a balanced diet that includes: ? 5 or more servings of fruits and vegetables each day. At each meal, try to fill half of your plate with fruits and vegetables. ? Up to 6-8 servings of whole grains each day. ? Less than 6 oz of lean meat, poultry, or fish each day. A 3-oz serving of meat is about the same size as a deck of cards. One egg equals 1 oz. ? 2 servings of low-fat dairy each day. ? A serving of nuts, seeds, or beans 5 times each week. ? Heart-healthy fats. Healthy fats called Omega-3 fatty acids are found in foods such as flaxseeds and coldwater fish, like sardines, salmon, and mackerel.  Limit how much you eat of the following: ? Canned or prepackaged foods. ? Food that is high in trans fat, such as fried foods. ? Food that is high in saturated fat, such as fatty meat. ? Sweets, desserts, sugary drinks,  and other foods with added sugar. ? Full-fat dairy products.  Do not salt foods before eating.  Try to eat at least 2 vegetarian meals each week.  Eat more home-cooked food and less restaurant, buffet, and fast food.  When eating at a restaurant, ask that your food be prepared with less salt or no salt, if possible. What foods are recommended? The items listed may not be a complete list. Talk with your dietitian about what dietary choices are best for you. Grains Whole-grain or whole-wheat bread. Whole-grain or whole-wheat pasta. Brown rice. [place].  Bulgur. Whole-grain and low-sodium cereals. Pita bread. Low-fat, low-sodium crackers. Whole-wheat flour tortillas. Vegetables Fresh or frozen vegetables (raw, steamed, roasted, or grilled). Low-sodium or reduced-sodium tomato and vegetable juice. Low-sodium or reduced-sodium tomato sauce and tomato paste. Low-sodium or reduced-sodium canned vegetables. Fruits All fresh, dried, or frozen fruit. Canned fruit in natural juice (without added sugar). Meat and other protein foods Skinless chicken or [place]. Ground chicken or [place]. Pork with fat trimmed off. Fish and seafood. Egg whites. Dried beans, peas, or lentils. Unsalted nuts, nut butters, and seeds. Unsalted canned beans. Lean cuts of beef with fat trimmed off. Low-sodium, lean deli meat. Dairy Low-fat (1%) or fat-free (skim) milk. Fat-free, low-fat, or reduced-fat cheeses. Nonfat, low-sodium ricotta or cottage cheese. Low-fat or nonfat yogurt. Low-fat, low-sodium cheese. Fats and oils Soft margarine without trans fats. Vegetable oil. Low-fat, reduced-fat, or light mayonnaise and salad dressings (reduced-sodium). Canola, safflower, olive, soybean, and sunflower oils. Avocado. Seasoning and other foods Herbs. Spices. Seasoning mixes without salt. Unsalted popcorn and pretzels. Fat-free sweets. What foods are not recommended? The items listed may not be a complete list. Talk with your dietitian about what dietary choices are best for you. Grains Baked goods made with fat, such as croissants, muffins, or some breads. Dry pasta or rice meal packs. Vegetables Creamed or fried vegetables. Vegetables in a cheese sauce. Regular canned vegetables (not low-sodium or reduced-sodium). Regular canned tomato sauce and paste (not low-sodium or reduced-sodium). Regular tomato and vegetable juice (not low-sodium or reduced-sodium). Angie Fava. Olives. Fruits Canned fruit in a light or heavy syrup. Fried fruit. Fruit in cream or butter sauce. Meat and other  protein foods Fatty cuts of meat. Ribs. Fried meat. Berniece Salines. Sausage. Bologna and other processed lunch meats. Salami. Fatback. Hotdogs. Bratwurst. Salted nuts and seeds. Canned beans with added salt. Canned or smoked fish. Whole eggs or egg yolks. Chicken or [place] with skin. Dairy Whole or 2% milk, cream, and half-and-half. Whole or full-fat cream cheese. Whole-fat or sweetened yogurt. Full-fat cheese. Nondairy creamers. Whipped toppings. Processed cheese and cheese spreads. Fats and oils Butter. Stick margarine. Lard. Shortening. Ghee. Bacon fat. Tropical oils, such as coconut, palm kernel, or palm oil. Seasoning and other foods Salted popcorn and pretzels. Onion salt, garlic salt, seasoned salt, table salt, and sea salt. Worcestershire sauce. Tartar sauce. Barbecue sauce. Teriyaki sauce. Soy sauce, including reduced-sodium. Steak sauce. Canned and packaged gravies. Fish sauce. Oyster sauce. Cocktail sauce. Horseradish that you find on the shelf. Ketchup. Mustard. Meat flavorings and tenderizers. Bouillon cubes. Hot sauce and Tabasco sauce. Premade or packaged marinades. Premade or packaged taco seasonings. Relishes. Regular salad dressings. Where to find more information:  National Heart, Lung, and [place]: https://wilson-eaton.com/  American Heart Association: www.heart.org Summary  The DASH eating plan is a healthy eating plan that has been shown to reduce high blood pressure (hypertension). It may also reduce your risk for type 2 diabetes, heart disease, and stroke.  With the DASH eating plan, you should limit salt (sodium) intake to 2,300 mg a day. If you have hypertension, you may need to reduce your sodium intake to 1,500 mg a day.  When on the DASH eating plan, aim to eat more fresh fruits and vegetables, whole grains, lean proteins, low-fat dairy, and heart-healthy fats.  Work with your health care provider or diet and nutrition specialist (dietitian) to adjust your eating plan to your  individual calorie needs. This information is not intended to replace advice given to you by your health care provider. Make sure you discuss any questions you have with your health care provider. Document Released: 10/28/2011 Document Revised: 11/01/2016 Document Reviewed: 11/01/2016 Elsevier Interactive Patient Education  [employer].

## 2018-08-15 LAB — LIPID PANEL
Chol/HDL Ratio: 3 ratio (ref 0.0–4.4)
Cholesterol, Total: 147 mg/dL (ref 100–199)
HDL: 49 mg/dL (ref >39)
LDL Calculated: 78 mg/dL (ref 0–99)
Triglycerides: 101 mg/dL (ref 0–149)
VLDL Cholesterol Cal: 20 mg/dL (ref 5–40)

## 2018-08-15 LAB — CBC
Hematocrit: 37.8 % (ref 34.0–46.6)
Hemoglobin: 12.1 g/dL (ref 11.1–15.9)
MCH: 27.6 pg (ref 26.6–33.0)
MCHC: 32 g/dL (ref 31.5–35.7)
MCV: 86 fL (ref 79–97)
PLATELETS: 276 10*3/uL (ref 150–450)
RBC: 4.39 x10E6/uL (ref 3.77–5.28)
RDW: 15.9 % — AB (ref 12.3–15.4)
WBC: 4.3 10*3/uL (ref 3.4–10.8)

## 2018-08-15 NOTE — Progress Notes (Signed)
Internal Medicine Clinic Attending  Case discussed with Dr. Amin at the time of the visit.  We reviewed the resident's history and exam and pertinent patient test results.  I agree with the assessment, diagnosis, and plan of care documented in the resident's note.    

## 2018-12-05 ENCOUNTER — Encounter: Payer: Self-pay | Admitting: Internal Medicine

## 2019-01-18 ENCOUNTER — Other Ambulatory Visit: Payer: Self-pay

## 2019-01-18 ENCOUNTER — Ambulatory Visit (HOSPITAL_COMMUNITY)
Admission: EM | Admit: 2019-01-18 | Discharge: 2019-01-18 | Disposition: A | Discharge status: Home / Self Care | Payer: BLUE CROSS/BLUE SHIELD | Attending: Emergency Medicine | Admitting: Emergency Medicine

## 2019-01-18 ENCOUNTER — Ambulatory Visit (INDEPENDENT_AMBULATORY_CARE_PROVIDER_SITE_OTHER): Payer: BLUE CROSS/BLUE SHIELD

## 2019-01-18 ENCOUNTER — Encounter (HOSPITAL_COMMUNITY): Payer: Self-pay | Admitting: Emergency Medicine

## 2019-01-18 DIAGNOSIS — R05 Cough: Secondary | ICD-10-CM | POA: Diagnosis not present

## 2019-01-18 DIAGNOSIS — R0789 Other chest pain: Secondary | ICD-10-CM

## 2019-01-18 DIAGNOSIS — B9789 Other viral agents as the cause of diseases classified elsewhere: Secondary | ICD-10-CM | POA: Diagnosis not present

## 2019-01-18 DIAGNOSIS — J9801 Acute bronchospasm: Secondary | ICD-10-CM | POA: Diagnosis not present

## 2019-01-18 DIAGNOSIS — J069 Acute upper respiratory infection, unspecified: Secondary | ICD-10-CM

## 2019-01-18 DIAGNOSIS — R062 Wheezing: Secondary | ICD-10-CM | POA: Diagnosis not present

## 2019-01-18 LAB — BASIC METABOLIC PANEL
ANION GAP: 10 (ref 5–15)
BUN: 5 mg/dL — ABNORMAL LOW (ref 6–20)
CO2: 23 mmol/L (ref 22–32)
Calcium: 9.1 mg/dL (ref 8.9–10.3)
Chloride: 106 mmol/L (ref 98–111)
Creatinine, Ser: 0.61 mg/dL (ref 0.44–1.00)
GFR calc Af Amer: >60 mL/min (ref >60)
GFR calc non Af Amer: >60 mL/min (ref >60)
Glucose, Bld: 73 mg/dL (ref 70–99)
Potassium: 3.4 mmol/L — ABNORMAL LOW (ref 3.5–5.1)
Sodium: 139 mmol/L (ref 135–145)

## 2019-01-18 LAB — BRAIN NATRIURETIC PEPTIDE: B NATRIURETIC PEPTIDE 5: 88 pg/mL (ref 0.0–100.0)

## 2019-01-18 MED ORDER — AEROCHAMBER PLUS MISC: 2 refills | Status: AC

## 2019-01-18 MED ORDER — ALBUTEROL SULFATE HFA 108 (90 BASE) MCG/ACT IN AERS: 1.0000 | INHALATION_SPRAY | Freq: Four times a day (QID) | RESPIRATORY_TRACT | 0 refills | Status: AC | PRN

## 2019-01-18 MED ORDER — IBUPROFEN 600 MG PO TABS: 600.0000 mg | ORAL_TABLET | Freq: Four times a day (QID) | ORAL | 0 refills | Status: DC | PRN

## 2019-01-18 MED ORDER — BENZONATATE 200 MG PO CAPS: 200.0000 mg | ORAL_CAPSULE | Freq: Three times a day (TID) | ORAL | 0 refills | Status: DC | PRN

## 2019-01-18 MED ORDER — IPRATROPIUM-ALBUTEROL 0.5-2.5 (3) MG/3ML IN SOLN
RESPIRATORY_TRACT | Status: AC
  Filled 2019-01-18: qty 3

## 2019-01-18 MED ORDER — PREDNISONE 50 MG PO TABS: 50.0000 mg | ORAL_TABLET | Freq: Every day | ORAL | 0 refills | Status: AC

## 2019-01-18 MED ORDER — IPRATROPIUM-ALBUTEROL 0.5-2.5 (3) MG/3ML IN SOLN
3.0000 mL | Freq: Once | RESPIRATORY_TRACT | Status: AC
  Administered 2019-01-18: 3 mL via RESPIRATORY_TRACT

## 2019-01-18 MED FILL — AEROCHAMBER: 30 days supply | Qty: 1 | Fill #0

## 2019-01-18 MED FILL — IBUPROFEN 600 MG TABLET: 600 | 7 days supply | Qty: 30 | Fill #0

## 2019-01-18 MED FILL — VENTOLIN HFA 90 MCG INHALER: 108 (90 BAS | 25 days supply | Qty: 18 | Fill #0

## 2019-01-18 MED FILL — BENZONATATE 200 MG CAPS: 200 | 10 days supply | Qty: 30 | Fill #0

## 2019-01-18 MED FILL — predniSONE 50 MG TABS: 50 | 5 days supply | Qty: 5 | Fill #0

## 2019-01-18 NOTE — ED Provider Notes (Signed)
HPI  SUBJECTIVE:  Andrea Long is a 53 y.o. female who presents with nasal congestion, rhinorrhea, postnasal drip, sinus pain and pressure, nonproductive cough for the past 3 days.  She reports itchy, watery eyes, sneezing, wheezing.  She reports shortness of breath when she lies down flat.  No other shortness of breath or dyspnea on exertion.  She reports intermittent central chest pain described as soreness.  It lasts up to an hour and then resolves.  It does not radiate up her neck, down her arm, through to her back.  She denies nausea or diaphoresis.  No exertional component. She states that she is sleeping on 2-3 pillows at night, normally sleeps on one.  States that she is waking up at night short of breath.  No unintentional weight gain, patient normally weighs 162 pounds, no lower extremity edema, nocturia.  She tried over-the-counter mucus medication, Tylenol, Flonase, Zyrtec.  Tylenol helps with the chest pain, Flonase, Zyrtec helps with the nasal congestion.  She states the chest pain is worse with lifting, coughing, bending forward.  She has been doing a lot of coughing and lifting recently.  It is not associated with arm movement, inspiration.  She also reports belching, water brash, burning chest pain.  States that her GERD has been bothering her.  She reports right posterior back soreness which is worse with arm movement.  No abdominal pain, syncope.  No left-sided back/rib pain.  No antibiotics in the past month.  No antipyretic in the past 4 to 6 hours.  She has a past medical history of GERD.  No history of hypertension, CHF, asthma, emphysema, COPD, smoking, hypercholesterolemia, diabetes, coronary disease, MI, PE, DVT.  Family history significant for MI with a mom at 52.  PMD: Lorella Nimrod, MD   Past Medical History:  Diagnosis Date  . Dysmenorrhea   . Endometriosis 2016  . Plantar fasciitis of right foot     Past Surgical History:  Procedure Laterality Date  . CESAREAN  SECTION    . DILATION AND CURETTAGE OF UTERUS      Family History  Problem Relation Age of Onset  . Diabetes Mother     Social History   Tobacco Use  . Smoking status: Never Smoker  . Smokeless tobacco: Never Used  Substance Use Topics  . Alcohol use: No  . Drug use: No    No current facility-administered medications for this encounter.   Current Outpatient Medications:  .  Ascorbic Acid (VITAMIN C) 1000 MG tablet, Take 1,000 mg by mouth daily., Disp: , Rfl:  .  calcium-vitamin D (OSCAL 500/200 D-3) 500-200 MG-UNIT tablet, Take 1 tablet by mouth 2 (two) times daily., Disp: 180 tablet, Rfl: 3 .  cetirizine (ZYRTEC) 10 MG tablet, Take 1 tablet (10 mg total) by mouth daily., Disp: 30 tablet, Rfl: 0 .  albuterol (PROVENTIL HFA;VENTOLIN HFA) 108 (90 Base) MCG/ACT inhaler, Inhale 1-2 puffs into the lungs every 6 (six) hours as needed for wheezing or shortness of breath., Disp: 1 Inhaler, Rfl: 0 .  benzonatate (TESSALON) 200 MG capsule, Take 1 capsule (200 mg total) by mouth 3 (three) times daily as needed for cough., Disp: 30 capsule, Rfl: 0 .  ibuprofen (ADVIL,MOTRIN) 600 MG tablet, Take 1 tablet (600 mg total) by mouth every 6 (six) hours as needed., Disp: 30 tablet, Rfl: 0 .  predniSONE (DELTASONE) 50 MG tablet, Take 1 tablet (50 mg total) by mouth daily with breakfast for 5 days., Disp: 5 tablet, Rfl: 0 .  Spacer/Aero-Holding Chambers (AEROCHAMBER PLUS) inhaler, Use as instructed, Disp: 1 each, Rfl: 2  No Known Allergies   ROS  As noted in HPI.   Physical Exam  BP (!) 175/90   Pulse 76   Temp 98.7 F (37.1 C) (Oral)   Resp 16   Wt 75.4 kg   SpO2 100%   BMI 31.40 kg/m   Constitutional: Well developed, well nourished, no acute distress Eyes: PERRL, EOMI, conjunctiva normal bilaterally HENT: Normocephalic, atraumatic,mucus membranes moist.  Positive clear nasal congestion.  Normal turbinates.  No frontal, maxillary sinus tenderness.  No obvious postnasal drip or  cobblestoning. Respiratory: Diffuse wheezing, prolonged expiratory phase, positive reproducible left-sided chest wall tenderness along the sternum and the costochondral junctions.  Cardiovascular: Normal rate and rhythm, no murmurs, no gallops, no rubs.  No JVD GI: Soft, nondistended, nontender, no rebound, no guarding.  No hepatomegaly. Back: Positive tenderness along the right latissumus.  No other tenderness over the entire back. skin: No rash, skin intact Musculoskeletal: Symmetric, nontender, no palpable cord, no edema, no tenderness, no deformities Neurologic: Alert & oriented x 3, CN II-XII grossly intact, no motor deficits, sensation grossly intact Psychiatric: Speech and behavior appropriate   ED Course   Medications  ipratropium-albuterol (DUONEB) 0.5-2.5 (3) MG/3ML nebulizer solution 3 mL (3 mLs Nebulization Given 01/18/19 1436)    Orders Placed This Encounter  Procedures  . DG Chest 2 View    Standing Status:   Standing    Number of Occurrences:   1    Order Specific Question:   Reason for Exam (SYMPTOM  OR DIAGNOSIS REQUIRED)    Answer:   CP, cough wheeze r/o CHF/pulm edema, PNA  . Brain natriuretic peptide    Standing Status:   Standing    Number of Occurrences:   1  . Basic metabolic panel    Standing Status:   Standing    Number of Occurrences:   1  . ED EKG    Standing Status:   Standing    Number of Occurrences:   1    Order Specific Question:   Reason for Exam    Answer:   Chest Pain   Results for orders placed or performed during the hospital encounter of 01/18/19 (from the past 24 hour(s))  Brain natriuretic peptide     Status: None   Collection Time: 01/18/19  2:40 PM  Result Value Ref Range   B Natriuretic Peptide 88.0 0.0 - 100.0 pg/mL  Basic metabolic panel     Status: Abnormal   Collection Time: 01/18/19  2:42 PM  Result Value Ref Range   Sodium 139 135 - 145 mmol/L   Potassium 3.4 (L) 3.5 - 5.1 mmol/L   Chloride 106 98 - 111 mmol/L   CO2 23 22  - 32 mmol/L   Glucose, Bld 73 70 - 99 mg/dL   BUN 5 (L) 6 - 20 mg/dL   Creatinine, Ser 0.61 0.44 - 1.00 mg/dL   Calcium 9.1 8.9 - 10.3 mg/dL   GFR calc non Af Amer >60 >60 mL/min   GFR calc Af Amer >60 >60 mL/min   Anion gap 10 5 - 15   Dg Chest 2 View  Result Date: 01/18/2019 CLINICAL DATA:  Cough, wheezing and chest pain. EXAM: CHEST - 2 VIEW COMPARISON:  None. FINDINGS: The heart size and mediastinal contours are within normal limits. Both lungs are clear. The visualized skeletal structures are unremarkable. IMPRESSION: No active cardiopulmonary disease. Electronically Signed   By:  Abelardo Diesel M.D.   On: 01/18/2019 14:47    ED Clinical Impression  Musculoskeletal chest pain  Bronchospasm  Viral URI with cough   ED Assessment/Plan  EKG: Normal sinus rhythm, rate 73.  Normal axis, normal intervals.  No hypertrophy. No ST elevation.  No previous EKG for comparison.  Patient weight 166.2.  Suspect URI versus allergies with resulting bronchospasm.  The chest pain appears to be very musculoskeletal.  CHF in the differential, but think this is less likely.  Will check chest x-ray, give DuoNeb and reevaluate.  Checking BMP, BNP.  reviewed imaging independently.  Chest x-ray normal.  See radiology report for details.  We will call patient at [phone] if her labs come back abnormal.  Reviewed imaging independently.  Normal chest x-ray. See radiology report for full details  On reevaluation, patient states that she feels significantly better.  States that shortness of breath has resolved.  She still has some wheezing and a prolonged expiratory phase, but improved air movement.  Plan to send home with regular albuterol with a spacer 2 puffs every 4 hours for the next 2 days, then every 6 hours for the next 2 days and then as needed.  Prednisone 50 mg, ibuprofen 600 mg combined with 1 g of Tylenol, continue Flonase, Zyrtec, Tessalon.  She will follow-up with her primary care  physician tomorrow.  Gave her very strict ER return precautions.  We will give her a work note for Saturday and Sunday.  Normal BNP.  Borderline low potassium otherwise normal BMP. D/w pt after confirming identity with date of birth.  Advised increasedf p.o. intake of potassium rich foods.  Discussed MDM, treatment plan, and plan for follow-up with patient. Discussed sn/sx that should prompt return to the ED. patient agrees with plan.   Meds ordered this encounter  Medications  . ipratropium-albuterol (DUONEB) 0.5-2.5 (3) MG/3ML nebulizer solution 3 mL  . albuterol (PROVENTIL HFA;VENTOLIN HFA) 108 (90 Base) MCG/ACT inhaler    Sig: Inhale 1-2 puffs into the lungs every 6 (six) hours as needed for wheezing or shortness of breath.    Dispense:  1 Inhaler    Refill:  0  . Spacer/Aero-Holding Chambers (AEROCHAMBER PLUS) inhaler    Sig: Use as instructed    Dispense:  1 each    Refill:  2  . benzonatate (TESSALON) 200 MG capsule    Sig: Take 1 capsule (200 mg total) by mouth 3 (three) times daily as needed for cough.    Dispense:  30 capsule    Refill:  0  . ibuprofen (ADVIL,MOTRIN) 600 MG tablet    Sig: Take 1 tablet (600 mg total) by mouth every 6 (six) hours as needed.    Dispense:  30 tablet    Refill:  0  . predniSONE (DELTASONE) 50 MG tablet    Sig: Take 1 tablet (50 mg total) by mouth daily with breakfast for 5 days.    Dispense:  5 tablet    Refill:  0    *This clinic note was created using [employer]. Therefore, there may be occasional mistakes despite careful proofreading.  ?   Melynda Ripple, MD 01/19/19 [phone]

## 2019-01-18 NOTE — Discharge Instructions (Addendum)
Keep an eye on your blood pressure.  It was a little elevated today.  I will call you if your labs come back abnormal.  You can also call here in several hours and ask for Dr. Alphonzo Cruise if you want to get your lab results.  Make sure you call before 8 PM.  You need to be seen by your doctor tomorrow, or Saturday.  regular albuterol with a spacer 2 puffs every 4 hours for the next 2 days, then every 6 hours for the next 2 days and then as needed.  Prednisone 50 mg, ibuprofen 600 mg combined with 1 g of Tylenol, continue Flonase, Zyrtec, Tessalon for the cough

## 2019-01-18 NOTE — ED Triage Notes (Signed)
PT reports cough for 2 days. Central chest pain with deep breaths. PT reports pain radiates to back.

## 2019-05-21 ENCOUNTER — Encounter: Payer: Self-pay | Admitting: *Deleted

## 2019-05-30 ENCOUNTER — Other Ambulatory Visit: Payer: Self-pay

## 2019-05-30 ENCOUNTER — Ambulatory Visit (INDEPENDENT_AMBULATORY_CARE_PROVIDER_SITE_OTHER): Payer: BC Managed Care – PPO | Admitting: Family Medicine

## 2019-05-30 ENCOUNTER — Encounter: Payer: Self-pay | Admitting: Family Medicine

## 2019-05-30 VITALS — BP 122/80 | Ht 61.0 in | Wt 163.0 lb

## 2019-05-30 DIAGNOSIS — M67442 Ganglion, left hand: Secondary | ICD-10-CM

## 2019-05-30 DIAGNOSIS — M25562 Pain in left knee: Secondary | ICD-10-CM | POA: Diagnosis not present

## 2019-05-30 MED ORDER — METHYLPREDNISOLONE ACETATE 40 MG/ML IJ SUSP
40.0000 mg | Freq: Once | INTRAMUSCULAR | Status: AC
  Administered 2019-05-30: 40 mg via INTRA_ARTICULAR

## 2019-05-30 NOTE — Patient Instructions (Signed)
You have pes tendinitis of your knee. Ice the area 15 minutes at a time 3-4 times a day. You were given an injection for this today. Ibuprofen 600mg  three times a day with food for pain and inflammation OR aleve 2 tabs twice a day with food. Wait a few days then start the home exercises 3 sets of 10 once a day.  You also have a small cyst overlying your thumb - so small that nothing can be aspirated from this but we punctured the wall of this and it resolves in more than half the cases. We can repeat this if not improving.  If still not improving would consider referral to a hand surgeon for removal.

## 2019-05-30 NOTE — Progress Notes (Signed)
HPI: Andrea Long is a 53yo F presenting for 10 days of L knee pain and L thumb discomfort  L Knee: Works at [employer] and recently moved to [employer] requiring more bending and going up/down stairs. Pain worse on medial R leg below knee Pain worse after work, rates 8-9/10 Improves with rest Waking her up at night No trauma or recent change in exercise No numbness, instability, weakness, swelling  L Thumb: Nodule at base of thumb, present for multiple months Pain with extension of thumb Has not tried anything for pain relief No trauma, surgery to area Denies numbness, weakness, redness, erythema  Objective: General: NAD L Knee: Inspection: (-) deformity, (-) genu valgus, (-) genu varus, (-) effusion, (-) swelling Tenderness:  [Medial]  (-) Joint Line  (-) Tibial Tubercle  (-) MCL      (-) Medial Hamstring         [Lateral]  (-) Joint Line  (-) Fibular Head  (-) LCL      (-) ITB  (-) Lateral Hamstring      [Anterior]  (-) Quadricep  (-) Quadricep Tendon  (-) Patella      (-) MPFL  (-) LPFL  (-) Patellar Tendon      (-) Tibial Tuberosity  (+) Pes Anserine      [Posterior]  (-) Baker's Cyst  (-) Medial Gastrocnemius  (-) Lateral Gastrocnemius   Other  _          Range of Motion: 0-130 degrees (knee)  (+) full hip ROM Strength:    Knee Flexion: 5/5    Knee Extension: 5/5    Hip Flexion: 5/5    Hip Extension: 5/5    Pain with AROM of gracilis and sartorius, TTP along medial hamstring tendon with associated tightness Special Tests:  (_) Anterior Drawer  (_) Posterior Drawer  (_) Lachman   (_) McMurray's  (_) Valgus Stress 0deg  (_) Valgus Stress 30deg   (_) Varus Stress 0deg  (_) Varus Stress 30deg  (_) Patellar Tilt   (_) Patellofemoral Glide  (_) Patellar Apprehension  (_) Tredenlenberg   L Thumb: TTP with appreciable 62mm nodule at base of MP joint of thumb FROM and strength of thumb  MSK [place]: Mild swelling of L pes anserine bursa. Cyst seen at base of R thumb too  small for aspiration  Procedures: Risks and benefits of procedure explained. Consent form signed. Pt prepped in sterile fashion. The needle was inserted perpendicularly to the skin at 90 degree angle at maximal point of tenderness on L pes anserine bursa. A mixture of 14mL bupivicaine and 1 cc depomedrol was injected into the bursa. The patient tolerated the procedure well and reported pain relief following the injection. Pt provided with post-procedural care instructions and RTC criteria.  Risks and benefits of procedure explained. Consent form signed. Pt prepped in sterile fashion. A 22g needles was inserted into the cyst. Failed to aspirate fluid. The patient tolerated the procedure well and reported pain relief following the injection. Pt provided with post-procedural care instructions and RTC criteria.  A&P 1. L Leg Pain- 2/2 to pes anserine pain syndrome. Successful CSI to bursa with home instructions. Work note given for time off work with restrictions afterwards. Start home exercise program  2. L thumb cyst- [place] did not show trigger thumb. Outside of cyst was punctured with needle with flattening of cyst after procedure. Ice area QD. F/u in 2-4 weeks if no improvement. Consider surgical referral if no improvement of  symptoms

## 2019-08-11 ENCOUNTER — Other Ambulatory Visit: Payer: Self-pay

## 2019-08-11 DIAGNOSIS — Z20822 Contact with and (suspected) exposure to covid-19: Secondary | ICD-10-CM

## 2019-08-12 LAB — NOVEL CORONAVIRUS, NAA: SARS-CoV-2, NAA: NOT DETECTED

## 2019-11-25 ENCOUNTER — Inpatient Hospital Stay
Admission: RE | Admit: 2019-11-25 | Discharge: 2019-11-25 | Disposition: A | Discharge status: Home / Self Care | Payer: BC Managed Care – PPO | Source: Ambulatory Visit

## 2020-04-07 ENCOUNTER — Encounter: Payer: Self-pay | Admitting: Internal Medicine

## 2020-04-07 ENCOUNTER — Ambulatory Visit (INDEPENDENT_AMBULATORY_CARE_PROVIDER_SITE_OTHER): Payer: BC Managed Care – PPO | Admitting: Internal Medicine

## 2020-04-07 ENCOUNTER — Other Ambulatory Visit: Payer: Self-pay

## 2020-04-07 VITALS — BP 143/90 | HR 78 | Temp 98.3°F | Ht 61.0 in | Wt 175.8 lb

## 2020-04-07 DIAGNOSIS — Z0000 Encounter for general adult medical examination without abnormal findings: Secondary | ICD-10-CM | POA: Diagnosis not present

## 2020-04-07 DIAGNOSIS — I10 Essential (primary) hypertension: Secondary | ICD-10-CM

## 2020-04-07 DIAGNOSIS — N924 Excessive bleeding in the premenopausal period: Secondary | ICD-10-CM | POA: Diagnosis not present

## 2020-04-07 DIAGNOSIS — R5383 Other fatigue: Secondary | ICD-10-CM

## 2020-04-07 MED ORDER — HYDROCHLOROTHIAZIDE 25 MG PO TABS: 25.0000 mg | ORAL_TABLET | Freq: Every day | ORAL | 0 refills | Status: DC

## 2020-04-07 MED FILL — HYDROCHLOROTHIAZIDE 25 MG T: 25 | 30 days supply | Qty: 30 | Fill #0

## 2020-04-07 NOTE — Patient Instructions (Addendum)
Ms. Kimbley,   It was a pleasure seeing you in person. Today we discussed:  Elevated Blood Pressure: Please follow low-sodium diet and exercise 30 minutes daily. I am also obtaining some blood work today and starting you on a blood pressure medication. Please take this daily. Please also record your BP daily on log and bring to your next appointment.   Please contact us if you have any questions or concerns.   Thank you!

## 2020-04-07 NOTE — Assessment & Plan Note (Addendum)
Patient noted to have BP 150/92, repeat 143/90. She did also have a ~13lb weight gain from prior visit. She notes low-sodium diet but also endorses a mostly sedentary lifestyle. 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  Encouraged patient for lifestyle modification with low-sodium diet and regular exercise (30 minutes daily).  Will also initiate HCTZ 25mg  daily.  Plan: HCTZ 25mg  daily  Lifestyle modification BP monitoring at home BP f/u in 4 weeks Lipid panel and TSH at this visit  BMP at follow up appointment

## 2020-04-07 NOTE — Progress Notes (Signed)
   CC: elevated BP, fatigue  HPI:  Andrea Long is a 54 y.o. female with PMHx as listed below presenting for evaluation of elevated BP and fatigue. Please see problem based charting for assessment and plan of patient's current and chronic disease processes.  Past Medical History:  Diagnosis Date  . Dysmenorrhea   . Endometriosis 2016  . Plantar fasciitis of right foot    Review of Systems:  Negative except as stated in HPI.   Physical Exam:  Vitals:   04/07/20 1543  BP: (!) 150/92  Pulse: 77  Temp: 98.3 F (36.8 C)  TempSrc: Oral  SpO2: 99%  Weight: 175 lb 12.8 oz (79.7 kg)  Height: 5\' 1"  (1.549 m)   Physical Exam Constitutional:      General: She is not in acute distress.    Appearance: She is not ill-appearing.  HENT:     Head: Normocephalic and atraumatic.  Eyes:     General: No scleral icterus.    Extraocular Movements: Extraocular movements intact.     Conjunctiva/sclera: Conjunctivae normal.  Cardiovascular:     Rate and Rhythm: Normal rate and regular rhythm.     Pulses: Normal pulses.     Heart sounds: Normal heart sounds.  Pulmonary:     Effort: Pulmonary effort is normal. No respiratory distress.     Breath sounds: Normal breath sounds. No wheezing.  Abdominal:     General: Bowel sounds are normal. There is no distension.     Palpations: Abdomen is soft.     Tenderness: There is no abdominal tenderness.  Musculoskeletal:        General: Normal range of motion.     Cervical back: Normal range of motion and neck supple. No tenderness.  Lymphadenopathy:     Cervical: No cervical adenopathy.  Skin:    General: Skin is warm and dry.     Capillary Refill: Capillary refill takes less than 2 seconds.     Findings: No bruising, erythema or rash.  Neurological:     General: No focal deficit present.     Mental Status: She is alert and oriented to person, place, and time. Mental status is at baseline.      Assessment & Plan:   See Encounters  Tab for problem based charting.  Patient discussed with Dr. Evette Doffing

## 2020-04-08 LAB — LIPID PANEL
Chol/HDL Ratio: 3.3 ratio (ref 0.0–4.4)
Cholesterol, Total: 166 mg/dL (ref 100–199)
HDL: 50 mg/dL (ref >39)
LDL Chol Calc (NIH): 95 mg/dL (ref 0–99)
Triglycerides: 120 mg/dL (ref 0–149)
VLDL Cholesterol Cal: 21 mg/dL (ref 5–40)

## 2020-04-08 LAB — BMP8+ANION GAP
Anion Gap: 19 mmol/L — ABNORMAL HIGH (ref 10.0–18.0)
BUN/Creatinine Ratio: 20 (ref 9–23)
BUN: 12 mg/dL (ref 6–24)
CO2: 20 mmol/L (ref 20–29)
Calcium: 9.1 mg/dL (ref 8.7–10.2)
Chloride: 101 mmol/L (ref 96–106)
Creatinine, Ser: 0.6 mg/dL (ref 0.57–1.00)
GFR calc Af Amer: 120 mL/min/{1.73_m2} (ref >59)
GFR calc non Af Amer: 104 mL/min/{1.73_m2} (ref >59)
Glucose: 133 mg/dL — ABNORMAL HIGH (ref 65–99)
Potassium: 3.5 mmol/L (ref 3.5–5.2)
Sodium: 140 mmol/L (ref 134–144)

## 2020-04-08 LAB — CBC
Hematocrit: 39.4 % (ref 34.0–46.6)
Hemoglobin: 13.5 g/dL (ref 11.1–15.9)
MCH: 30.9 pg (ref 26.6–33.0)
MCHC: 34.3 g/dL (ref 31.5–35.7)
MCV: 90 fL (ref 79–97)
Platelets: 263 10*3/uL (ref 150–450)
RBC: 4.37 x10E6/uL (ref 3.77–5.28)
RDW: 13.2 % (ref 11.7–15.4)
WBC: 5.6 10*3/uL (ref 3.4–10.8)

## 2020-04-08 LAB — TSH: TSH: 2.5 u[IU]/mL (ref 0.450–4.500)

## 2020-04-08 NOTE — Assessment & Plan Note (Addendum)
Patient has history of endometriosis with perimenopausal menorrhagia for which she has Mirena IUD. LMP was October 2018. However, IUD still in place. Patient to follow up with OB/GYN.   Plan: CBC today

## 2020-04-08 NOTE — Assessment & Plan Note (Signed)
Patient received COVID vaccine  She notes that she will get Pap smear and mammogram on 04/10/2020. Will follow up on this.

## 2020-04-09 NOTE — Progress Notes (Signed)
Internal Medicine Clinic Attending  Case discussed with Dr. Aslam at the time of the visit.  We reviewed the resident's history and exam and pertinent patient test results.  I agree with the assessment, diagnosis, and plan of care documented in the resident's note.  

## 2020-05-05 ENCOUNTER — Encounter: Payer: Self-pay | Admitting: Internal Medicine

## 2020-05-05 ENCOUNTER — Ambulatory Visit (INDEPENDENT_AMBULATORY_CARE_PROVIDER_SITE_OTHER): Payer: BC Managed Care – PPO | Admitting: Internal Medicine

## 2020-05-05 VITALS — BP 133/88 | HR 82 | Temp 98.9°F | Ht 60.0 in | Wt 172.1 lb

## 2020-05-05 DIAGNOSIS — I10 Essential (primary) hypertension: Secondary | ICD-10-CM | POA: Diagnosis not present

## 2020-05-05 DIAGNOSIS — E041 Nontoxic single thyroid nodule: Secondary | ICD-10-CM | POA: Diagnosis not present

## 2020-05-05 MED ORDER — HYDROCHLOROTHIAZIDE 25 MG PO TABS: 25.0000 mg | ORAL_TABLET | Freq: Every day | ORAL | 2 refills | Status: DC

## 2020-05-05 MED FILL — HYDROCHLOROTHIAZIDE 25 MG T: 25 | 30 days supply | Qty: 30 | Fill #0

## 2020-05-05 NOTE — Patient Instructions (Addendum)
It was nice seeing you today! Thank you for choosing [facility] for your Primary Care.    Today we talked about:   1. Blood pressure: Your blood pressure is much improved today! Keep up the great work with your diet and weight loss! Please continue taking HCTZ as well.   2. Thyroid: I ordered a thyroid ultrasound. Our clinic will call you to schedule this. Afterwards, I will call you with the results.

## 2020-05-05 NOTE — Progress Notes (Signed)
   CC: HTN follow up  HPI:  [name] is a 54 y.o. with a PMHx as listed below who presents to the clinic for HTN follow up.   Please see the Encounters tab for problem-based Assessment & Plan regarding status of patient's chronic conditions.  Past Medical History:  Diagnosis Date  . Dysmenorrhea   . Endometriosis 2016  . Plantar fasciitis of right foot    Review of Systems: Review of Systems  Constitutional: Positive for weight loss. Negative for chills and fever.  Respiratory: Negative for cough and shortness of breath.   Cardiovascular: Negative for chest pain and leg swelling.  Neurological: Negative for dizziness and headaches.   Physical Exam:  Vitals:   05/05/20 1613  BP: 133/88  Pulse: 82  Temp: 98.9 F (37.2 C)  TempSrc: Oral  SpO2: 97%  Weight: 172 lb 1.6 oz (78.1 kg)  Height: 5' (1.524 m)   Physical Exam Vitals and nursing note reviewed.  Constitutional:      General: She is not in acute distress.    Appearance: She is obese.  Neck:     Thyroid: No thyroid mass, thyromegaly or thyroid tenderness.  Cardiovascular:     Rate and Rhythm: Normal rate and regular rhythm.     Heart sounds: No murmur heard.  No gallop.   Pulmonary:     Effort: Pulmonary effort is normal. No respiratory distress.     Breath sounds: No wheezing or rales.  Lymphadenopathy:     Cervical: No cervical adenopathy.  Skin:    General: Skin is warm and dry.  Neurological:     General: No focal deficit present.     Mental Status: She is alert and oriented to person, place, and time. Mental status is at baseline.  Psychiatric:        Mood and Affect: Mood normal.        Behavior: Behavior normal.    Assessment & Plan:   See Encounters Tab for problem based charting.  Patient discussed with Dr. Heber [place]

## 2020-05-06 DIAGNOSIS — E041 Nontoxic single thyroid nodule: Secondary | ICD-10-CM | POA: Insufficient documentation

## 2020-05-06 NOTE — Assessment & Plan Note (Addendum)
[name] recently had numerous screening imaging and lab work done at another clinic and brought her results with her today.  Her carotid ultrasounds were within normal limits in terms of vascular findings, however were notable for a possible thyroid nodule.  [name] denies any significant weight loss or gain without intention, palpitations, changes in hair or nails, temperature intolerance.   Assessment/plan: Thyroid nodule seen on carotid ultrasound, likely incidental finding, however will evaluate.  Examination negative for any thyromegaly or nodules.  No tenderness on palpation.  TSH was within normal limits at last visit.  Discussed with patient who would like to proceed with dedicated thyroid ultrasound.  -Thyroid ultrasound

## 2020-05-06 NOTE — Assessment & Plan Note (Signed)
BP: 133/88   Andrea Long states that she has been able to take her HCTZ every day without difficulty.  In the past month, she has also cut out soda continue to work on eating healthy.  She is hopeful that with the weather start exercising outside.  Assessment/plan: Well-controlled on current regimen at this time.  Lab work performed at last visit was reviewed with the patient.  -Continue HCTZ 25 mg daily -Follow-up with PCP in 2 months

## 2020-05-07 NOTE — Progress Notes (Signed)
Internal Medicine Clinic Attending  Case discussed with Dr. Basaraba at the time of the visit.  We reviewed the resident's history and exam and pertinent patient test results.  I agree with the assessment, diagnosis, and plan of care documented in the resident's note.    

## 2020-05-29 ENCOUNTER — Ambulatory Visit (HOSPITAL_COMMUNITY): Payer: BC Managed Care – PPO

## 2020-09-02 ENCOUNTER — Other Ambulatory Visit: Payer: Self-pay | Admitting: Internal Medicine

## 2020-09-03 ENCOUNTER — Other Ambulatory Visit: Payer: Self-pay | Admitting: Student

## 2020-09-03 MED ORDER — HYDROCHLOROTHIAZIDE 25 MG PO TABS: 25.0000 mg | ORAL_TABLET | Freq: Every day | ORAL | 2 refills | Status: DC

## 2020-09-03 MED FILL — HYDROCHLOROTHIAZIDE 25 MG T: 25 | 30 days supply | Qty: 30 | Fill #0

## 2020-09-03 NOTE — Telephone Encounter (Signed)
I called and left a voicemail for patient that I will be refilling her Losartan to her pharmacy. I also encouraged her to call to schedule a follow up appointment with her PCP (Dr. Marva Panda) before the end of this month for follow up.   Jeralyn Bennett, PGY1 Internal Medicine [phone]

## 2020-09-04 ENCOUNTER — Other Ambulatory Visit: Payer: Self-pay

## 2020-09-04 ENCOUNTER — Ambulatory Visit (INDEPENDENT_AMBULATORY_CARE_PROVIDER_SITE_OTHER): Payer: BC Managed Care – PPO | Admitting: Internal Medicine

## 2020-09-04 ENCOUNTER — Encounter: Payer: Self-pay | Admitting: Internal Medicine

## 2020-09-04 VITALS — BP 134/86 | HR 72 | Temp 98.1°F | Ht 61.0 in | Wt 171.9 lb

## 2020-09-04 DIAGNOSIS — Z0000 Encounter for general adult medical examination without abnormal findings: Secondary | ICD-10-CM

## 2020-09-04 DIAGNOSIS — M549 Dorsalgia, unspecified: Secondary | ICD-10-CM | POA: Diagnosis not present

## 2020-09-04 DIAGNOSIS — I10 Essential (primary) hypertension: Secondary | ICD-10-CM

## 2020-09-04 DIAGNOSIS — E041 Nontoxic single thyroid nodule: Secondary | ICD-10-CM | POA: Diagnosis not present

## 2020-09-04 NOTE — Patient Instructions (Addendum)
Andrea Long,  It was a pleasure seeing you in clinic. Today we discussed:   Back pain:  At this time, I recommend trying heat or massage therapy. Back strengthening exercises provided below. Continue NSAIDs as needed (no more than 1 week at a time).    Back Exercises These exercises help to make your trunk and back strong. They also help to keep the lower back flexible. Doing these exercises can help to prevent back pain or lessen existing pain.  If you have back pain, try to do these exercises 2-3 times each day or as told by your doctor.  As you get better, do the exercises once each day. Repeat the exercises more often as told by your doctor.  To stop back pain from coming back, do the exercises once each day, or as told by your doctor. Exercises Single knee to chest Do these steps 3-5 times in a row for each leg: 1. Lie on your back on a firm bed or the floor with your legs stretched out. 2. Bring one knee to your chest. 3. Grab your knee or thigh with both hands and hold them it in place. 4. Pull on your knee until you feel a gentle stretch in your lower back or buttocks. 5. Keep doing the stretch for 10-30 seconds. 6. Slowly let go of your leg and straighten it. Pelvic tilt Do these steps 5-10 times in a row: 1. Lie on your back on a firm bed or the floor with your legs stretched out. 2. Bend your knees so they point up to the ceiling. Your feet should be flat on the floor. 3. Tighten your lower belly (abdomen) muscles to press your lower back against the floor. This will make your tailbone point up to the ceiling instead of pointing down to your feet or the floor. 4. Stay in this position for 5-10 seconds while you gently tighten your muscles and breathe evenly. Cat-cow Do these steps until your lower back bends more easily: 1. Get on your hands and knees on a firm surface. Keep your hands under your shoulders, and keep your knees under your hips. You may put padding under your  knees. 2. Let your head hang down toward your chest. Tighten (contract) the muscles in your belly. Point your tailbone toward the floor so your lower back becomes rounded like the back of a cat. 3. Stay in this position for 5 seconds. 4. Slowly lift your head. Let the muscles of your belly relax. Point your tailbone up toward the ceiling so your back forms a sagging arch like the back of a cow. 5. Stay in this position for 5 seconds.  Press-ups Do these steps 5-10 times in a row: 1. Lie on your belly (face-down) on the floor. 2. Place your hands near your head, about shoulder-width apart. 3. While you keep your back relaxed and keep your hips on the floor, slowly straighten your arms to raise the top half of your body and lift your shoulders. Do not use your back muscles. You may change where you place your hands in order to make yourself more comfortable. 4. Stay in this position for 5 seconds. 5. Slowly return to lying flat on the floor.  Bridges Do these steps 10 times in a row: 1. Lie on your back on a firm surface. 2. Bend your knees so they point up to the ceiling. Your feet should be flat on the floor. Your arms should be flat at your  sides, next to your body. 3. Tighten your butt muscles and lift your butt off the floor until your waist is almost as high as your knees. If you do not feel the muscles working in your butt and the back of your thighs, slide your feet 1-2 inches farther away from your butt. 4. Stay in this position for 3-5 seconds. 5. Slowly lower your butt to the floor, and let your butt muscles relax. If this exercise is too easy, try doing it with your arms crossed over your chest. Belly crunches Do these steps 5-10 times in a row: 1. Lie on your back on a firm bed or the floor with your legs stretched out. 2. Bend your knees so they point up to the ceiling. Your feet should be flat on the floor. 3. Cross your arms over your chest. 4. Tip your chin a little bit  toward your chest but do not bend your neck. 5. Tighten your belly muscles and slowly raise your chest just enough to lift your shoulder blades a tiny bit off of the floor. Avoid raising your body higher than that, because it can put too much stress on your low back. 6. Slowly lower your chest and your head to the floor. Back lifts Do these steps 5-10 times in a row: 1. Lie on your belly (face-down) with your arms at your sides, and rest your forehead on the floor. 2. Tighten the muscles in your legs and your butt. 3. Slowly lift your chest off of the floor while you keep your hips on the floor. Keep the back of your head in line with the curve in your back. Look at the floor while you do this. 4. Stay in this position for 3-5 seconds. 5. Slowly lower your chest and your face to the floor. Contact a doctor if:  Your back pain gets a lot worse when you do an exercise.  Your back pain does not get better 2 hours after you exercise. If you have any of these problems, stop doing the exercises. Do not do them again unless your doctor says it is okay. Get help right away if:  You have sudden, very bad back pain. If this happens, stop doing the exercises. Do not do them again unless your doctor says it is okay. This information is not intended to replace advice given to you by your health care provider. Make sure you discuss any questions you have with your health care provider. Document Revised: 08/03/2018 Document Reviewed: 08/03/2018 Elsevier Patient Education  [employer].   If you have any questions or concerns, please call our clinic at [phone] between 9am-5pm and after hours call [phone] and ask for the internal medicine resident on call. If you feel you are having a medical emergency please call 911.   Thank you, we look forward to helping you remain healthy!

## 2020-09-04 NOTE — Progress Notes (Signed)
   CC: hypertension f/u, upper back pain  HPI:  [name] is a 54 y.o. female with PMHx as listed below presenting for follow up of her hypertension and upper back pain. Please see problem based charting for complete assessment and plan.  Past Medical History:  Diagnosis Date  . Dysmenorrhea   . Endometriosis 2016  . Plantar fasciitis of right foot    Review of Systems:  Negative except as stated in HPI.  Physical Exam:  Vitals:   09/04/20 0925  BP: 134/86  Pulse: 72  Temp: 98.1 F (36.7 C)  TempSrc: Oral  SpO2: 100%  Weight: 171 lb 14.4 oz (78 kg)  Height: 5\' 1"  (1.549 m)   Physical Exam  Constitutional: Appears well-developed and well-nourished. No distress.  HENT: Normocephalic and atraumatic, EOMI, conjunctiva normal, moist mucous membranes Cardiovascular: Normal rate, regular rhythm, S1 and S2 present, no murmurs, rubs, gallops.  Distal pulses intact Respiratory: No respiratory distress, no accessory muscle use.  Effort is normal.  Lungs are clear to auscultation bilaterally. GI: Nondistended, soft, nontender to palpation, normal active bowel sounds Musculoskeletal: Normal bulk and tone.  No peripheral edema noted. TTP of paraspinal muscles in thoracic back; strength 5/5 in bilateral upper extremities; sensation to light touch grossly intact.  Neurological: Is alert and oriented x4, no apparent focal deficits noted. Skin: Warm and dry.  No rash, erythema, lesions noted. Psychiatric: Normal mood and affect. Behavior is normal. Judgment and thought content normal.    Assessment & Plan:   See Encounters Tab for problem based charting.  Patient discussed with Dr. Rebeca Alert

## 2020-09-05 DIAGNOSIS — M549 Dorsalgia, unspecified: Secondary | ICD-10-CM | POA: Insufficient documentation

## 2020-09-05 NOTE — Progress Notes (Signed)
Internal Medicine Clinic Attending  Case discussed with Dr. Aslam at the time of the visit.  We reviewed the resident's history and exam and pertinent patient test results.  I agree with the assessment, diagnosis, and plan of care documented in the resident's note.  Shomari Matusik, M.D., Ph.D.  

## 2020-09-05 NOTE — Assessment & Plan Note (Signed)
This was incidentally discovered during screening carotid US at an outside clinic. She is asymptomatic at this time. No thyromegaly or nodule appreciated on my examination today. Most recent TSH wnl. At her last clinic visit, thyroid US was ordered for further evaluation. Patient has not been able to get this yet.   Plan: - Patient encouraged to get the thyroid US; will follow up on results

## 2020-09-05 NOTE — Assessment & Plan Note (Signed)
Patient endorses ~2 weeks of thoracic back pain radiating to her right shoulder. She notes that on 10/2, she received her flu shot and she started noticing this back pain. She describes this as a "muscle pain". She has been taking some ibuprofen with some relief in symptoms but notes that her back pain persists once the ibuprofen wears off. She has also tried topical creams without any significant relief. She also endorses intermittent episodes of right arm paresthesias but denies any weakness. 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 She denies any trauma.  On examination, there is some tenderness to palpation along the thoracic paraspinal muscles bilaterally (R>L) but none at the shoulder or arms. No cervical spinal tenderness to palpation or limitations in neck movements. She has full ROM in bilateral shoulders without significant discomfort. Sensation to light touch is intact in bilateral upper extremities. Suspect possible muscle strain vs muscle spasms.   Plan: Will proceed with conservative treatment of continued ibuprofen, heat therapy, and recommend for back exercises and massage  If persistent, consider muscle relaxer

## 2020-09-05 NOTE — Assessment & Plan Note (Signed)
BP 134/86 at this visit. Patient is taking HCTZ 25mg  daily. Denies any headaches, vision changes, chest pain, dyspnea, or focal weakness.   Plan: Continue current regimen BMP in 3 months

## 2020-10-27 MED FILL — HYDROCHLOROTHIAZIDE 25 MG T: 25 | 30 days supply | Qty: 30 | Fill #1

## 2020-11-24 MED FILL — HYDROCHLOROTHIAZIDE 25 MG T: 25 | 30 days supply | Qty: 30 | Fill #2

## 2021-04-21 ENCOUNTER — Encounter: Payer: Self-pay | Admitting: Internal Medicine

## 2021-04-21 ENCOUNTER — Other Ambulatory Visit (HOSPITAL_COMMUNITY): Payer: Self-pay

## 2021-04-21 ENCOUNTER — Encounter: Payer: Self-pay | Admitting: Physician Assistant

## 2021-04-21 ENCOUNTER — Telehealth: Payer: Self-pay | Admitting: Physician Assistant

## 2021-04-21 ENCOUNTER — Telehealth: Payer: Self-pay | Admitting: *Deleted

## 2021-04-21 DIAGNOSIS — U071 COVID-19: Secondary | ICD-10-CM

## 2021-04-21 MED ORDER — NIRMATRELVIR/RITONAVIR (PAXLOVID)TABLET
ORAL_TABLET | ORAL | 0 refills | Status: DC
  Filled 2021-04-21: qty 30, 5d supply, fill #0

## 2021-04-21 MED ORDER — MOLNUPIRAVIR EUA 200MG CAPSULE
4.0000 | ORAL_CAPSULE | Freq: Two times a day (BID) | ORAL | 0 refills | Status: AC
  Filled 2021-04-21: qty 40, 5d supply, fill #0

## 2021-04-21 MED ORDER — BENZONATATE 100 MG PO CAPS
100.0000 mg | ORAL_CAPSULE | Freq: Three times a day (TID) | ORAL | 0 refills | Status: AC | PRN
  Filled 2021-04-21: qty 20, 7d supply, fill #0

## 2021-04-21 NOTE — Addendum Note (Signed)
Addended by: Abigail Butts on: 04/21/2021 12:27 PM   Modules accepted: Orders

## 2021-04-21 NOTE — Patient Instructions (Signed)
1. COVID-19  - proof of positive test  - will Rx Paxlovid and tessalon  - conservative therapies  - f/u with PCP as needed or in ER if symptoms worsen

## 2021-04-21 NOTE — Progress Notes (Signed)
Ms. jalon, blackwelder are scheduled for a virtual visit with your provider today.    Just as we do with appointments in the office, we must obtain your consent to participate.  Your consent will be active for this visit and any virtual visit you may have with one of our providers in the next 365 days.    If you have a MyChart account, I can also send a copy of this consent to you electronically.  All virtual visits are billed to your insurance company just like a traditional visit in the office.  As this is a virtual visit, video technology does not allow for your provider to perform a traditional examination.  This may limit your provider's ability to fully assess your condition.  If your provider identifies any concerns that need to be evaluated in person or the need to arrange testing such as labs, EKG, etc, we will make arrangements to do so.    Although advances in technology are sophisticated, we cannot ensure that it will always work on either your end or our end.  If the connection with a video visit is poor, we may have to switch to a telephone visit.  With either a video or telephone visit, we are not always able to ensure that we have a secure connection.   I need to obtain your verbal consent now.   Are you willing to proceed with your visit today?   Heidee L Pennywell has provided verbal consent on 04/21/2021 for a virtual visit (video or telephone).   Abigail Butts, PA-C 04/21/2021  8:34 AM   Date:  04/21/2021   ID:  Everrett Coombe, DOB 08-22-1966, MRN [id number]  Patient Location: Home Provider Location: Home Office   Participants: Patient and Provider for Visit and Wrap up  Method of visit: Video  Location of Patient: Home Location of Provider: Home Office Consent was obtain for visit over the video. Services rendered by provider: Visit was performed via video  A video enabled telemedicine application was used and I verified that I am speaking with the correct person using  two identifiers.  PCP:  Harvie Heck, MD   Chief Complaint:  COVID +  History of Present Illness:    ORMA CHEETHAM is a 55 y.o. female with history as stated below. Presents video telehealth for an acute care visit  Onset of symptoms was yesterday and symptoms have been persistent and include: body aches, cough, fever, chills  Pt reports she has been traveling to the [place].  She was negative on May 25th.    Modifying factors include: ibuprofen which helped with body aches  No other aggravating or relieving factors.  No other c/o.  The patient does have symptoms concerning for COVID-19 infection (fever, chills, cough, or new shortness of breath).  Patient has been tested for COVID during this illness - positive.  Past Medical, Surgical, Social History, Allergies, and Medications have been Reviewed.  Pt reports she is not taking the HCTZ at this time.  She reports she is taking Vitamin D and Vitamin C daily, but no other medications or vitamins.   Patient Active Problem List   Diagnosis Date Noted  . Upper back pain 09/05/2020  . Thyroid nodule 05/06/2020  . Essential hypertension 04/07/2020  . Chronic right-sided low back pain without sciatica 08/12/2017  . Health care maintenance 03/05/2015  . Seasonal allergies 03/05/2015  . Obesity (BMI 30.0-34.9) 03/05/2015  . Perimenopausal menorrhagia 12/06/2014    Social History  Tobacco Use  . Smoking status: Never Smoker  . Smokeless tobacco: Never Used  Substance Use Topics  . Alcohol use: No     Current Outpatient Medications:  .  benzonatate (TESSALON PERLES) 100 MG capsule, Take 1 capsule (100 mg total) by mouth 3 (three) times daily as needed for cough (cough)., Disp: 20 capsule, Rfl: 0 .  nirmatrelvir/ritonavir EUA (PAXLOVID) TABS, Take nirmatrelvir (150 mg) two tablets twice daily for 5 days and ritonavir (100 mg) one tablet twice daily for 5 days., Disp: 30 tablet, Rfl: 0 .  albuterol (PROVENTIL HFA;VENTOLIN  HFA) 108 (90 Base) MCG/ACT inhaler, Inhale 1-2 puffs into the lungs every 6 (six) hours as needed for wheezing or shortness of breath., Disp: 1 Inhaler, Rfl: 0 .  Ascorbic Acid (VITAMIN C) 1000 MG tablet, Take 1,000 mg by mouth daily., Disp: , Rfl:  .  calcium-vitamin D (OSCAL 500/200 D-3) 500-200 MG-UNIT tablet, Take 1 tablet by mouth 2 (two) times daily., Disp: 180 tablet, Rfl: 3 .  cetirizine (ZYRTEC) 10 MG tablet, Take 1 tablet (10 mg total) by mouth daily., Disp: 30 tablet, Rfl: 0 .  hydrochlorothiazide (HYDRODIURIL) 25 MG tablet, TAKE 1 TABLET (25 MG TOTAL) BY MOUTH DAILY., Disp: 30 tablet, Rfl: 2 .  levonorgestrel (MIRENA, 52 MG,) 20 MCG/24HR IUD, Mirena 20 mcg/24 hours (5 yrs) 52 mg intrauterine device  Take 1 device by intrauterine route., Disp: , Rfl:  .  Spacer/Aero-Holding Chambers (AEROCHAMBER PLUS) inhaler, Use as instructed, Disp: 1 each, Rfl: 2   No Known Allergies   Review of Systems  Constitutional: Positive for chills, fever and malaise/fatigue.  HENT: Positive for congestion and sore throat. Negative for ear pain.   Eyes: Negative for blurred vision and double vision.  Respiratory: Positive for cough. Negative for shortness of breath and wheezing.   Cardiovascular: Negative for chest pain, palpitations and leg swelling.  Gastrointestinal: Negative for abdominal pain, diarrhea, nausea and vomiting.  Genitourinary: Negative for dysuria.  Musculoskeletal: Positive for myalgias.  Skin: Negative for rash.  Neurological: Positive for headaches. Negative for loss of consciousness and weakness.  Psychiatric/Behavioral: The patient is not nervous/anxious.    See HPI for history of present illness.  Physical Exam Constitutional:      General: She is not in acute distress.    Appearance: Normal appearance. She is not ill-appearing.  HENT:     Head: Normocephalic and atraumatic.     Nose: No congestion.  Eyes:     Extraocular Movements: Extraocular movements intact.   Pulmonary:     Effort: Pulmonary effort is normal.     Comments: Dry cough Speaks in full sentences No accessory muscle usage Musculoskeletal:        General: Normal range of motion.     Cervical back: Normal range of motion.  Skin:    Coloration: Skin is not pale.  Neurological:     General: No focal deficit present.     Mental Status: She is alert. Mental status is at baseline.  Psychiatric:        Mood and Affect: Mood normal.               A&P  1. COVID-19  - proof of positive test  - will Rx Paxlovid and tessalon  - conservative therapies  - f/u with PCP as needed or in ER if symptoms worsen   Patient voiced understanding and agreement to plan.   Time:   Today, I have spent 15 minutes with the patient  with telehealth technology discussing the above problems, reviewing the chart, previous notes, medications and orders.    Tests Ordered: No orders of the defined types were placed in this encounter.   Medication Changes: Meds ordered this encounter  Medications  . nirmatrelvir/ritonavir EUA (PAXLOVID) TABS    Sig: Take nirmatrelvir (150 mg) two tablets twice daily for 5 days and ritonavir (100 mg) one tablet twice daily for 5 days.    Dispense:  30 tablet    Refill:  0  . benzonatate (TESSALON PERLES) 100 MG capsule    Sig: Take 1 capsule (100 mg total) by mouth 3 (three) times daily as needed for cough (cough).    Dispense:  20 capsule    Refill:  0     Disposition:  Follow up PCP as needed or in ER if symptoms worsen  Signed, Abigail Butts, PA-C  04/21/2021 8:44 AM

## 2021-04-21 NOTE — Telephone Encounter (Signed)
Call to inform pt and her husband we have schedule telehealth appts for them on Thursday June 2. Stated they need to be seen sooner; no appts available. Stated they will call  [facility]. Appts cancelled per their request.

## 2021-04-23 ENCOUNTER — Encounter: Payer: Self-pay | Admitting: Internal Medicine

## 2021-04-30 ENCOUNTER — Other Ambulatory Visit (HOSPITAL_COMMUNITY)
Admission: RE | Admit: 2021-04-30 | Discharge: 2021-04-30 | Disposition: A | Discharge status: Home / Self Care | Payer: Self-pay | Source: Ambulatory Visit | Attending: Internal Medicine | Admitting: Internal Medicine

## 2021-04-30 ENCOUNTER — Other Ambulatory Visit: Payer: Self-pay

## 2021-04-30 ENCOUNTER — Other Ambulatory Visit (HOSPITAL_COMMUNITY): Payer: Self-pay

## 2021-04-30 ENCOUNTER — Inpatient Hospital Stay (HOSPITAL_COMMUNITY): Admit: 2021-04-30 | Payer: Self-pay

## 2021-04-30 ENCOUNTER — Encounter: Payer: Self-pay | Admitting: Student

## 2021-04-30 ENCOUNTER — Ambulatory Visit (INDEPENDENT_AMBULATORY_CARE_PROVIDER_SITE_OTHER): Payer: Self-pay | Admitting: Student

## 2021-04-30 VITALS — BP 150/81 | HR 83 | Temp 98.1°F | Ht 61.0 in | Wt 175.6 lb

## 2021-04-30 DIAGNOSIS — Z0000 Encounter for general adult medical examination without abnormal findings: Secondary | ICD-10-CM | POA: Insufficient documentation

## 2021-04-30 DIAGNOSIS — I10 Essential (primary) hypertension: Secondary | ICD-10-CM

## 2021-04-30 DIAGNOSIS — E041 Nontoxic single thyroid nodule: Secondary | ICD-10-CM

## 2021-04-30 DIAGNOSIS — U071 COVID-19: Secondary | ICD-10-CM | POA: Insufficient documentation

## 2021-04-30 MED ORDER — AMLODIPINE BESYLATE 5 MG PO TABS
5.0000 mg | ORAL_TABLET | Freq: Every day | ORAL | 0 refills | Status: DC
  Filled 2021-04-30: qty 30, 30d supply, fill #0
  Filled 2021-05-28: qty 30, 30d supply, fill #1
  Filled 2021-06-29: qty 30, 30d supply, fill #2

## 2021-04-30 NOTE — Patient Instructions (Addendum)
Andrea Long,  It was a pleasure to see you in clinic today.   For your COVID infection, it looks like you are getting better and your lungs sounded good. Your body will clear the infection on its own and you do not need to get another test. If however you start developing symptoms again or continue feeling tired long term, please come back to see [place] again.   For your hypertension, we are going to check your kidney function and then prescribe a new medicine called amlodipine. This medicine can cause some leg swelling but is generally well tolerated and does not require routine blood work.  For your shingles vaccine, you can get it now but we recommend waiting until you feel better from your COVID infection so you are able to better tolerate it.

## 2021-04-30 NOTE — Assessment & Plan Note (Addendum)
Was told to get a thyroid ultrasound since this nodule was found incidentally on a carotid ultrasound but has not done it yet because she has been unsure of where to get it. Denies palpitations, diarrhea, weight loss. Endorses diaphoresis and fatigue since having COVID last week. TSH within normal limits at the last visit.   A&P: Incidentally discovered thyroid nodule without any signs of hyperthyroidism. No palpable nodule on exam. - Placed order for thyroid ultrasound at [facility] - to obtain after recovering from current COVID infection

## 2021-04-30 NOTE — Assessment & Plan Note (Addendum)
Patient reports being in the [place] for 3 months with her husband and both tested negative when re-entering the [place] at the end of May. Her husband then developed symptoms and tested positive. She then also developed symptoms and tested positive last week (May 31st). She was seen at Urgent Care via a virtual visit the same day and was prescribed molnupiravir and benzonatate, both of which she has finished taking. Overall her symptoms have improved, including fever, cough, and fatigue. Denies nausea, vomiting, diarrhea, constipation, or loss of sense of smell or taste. Endorses continued muscle cramps, mostly involving the left calf, back, and abdomen. She did test positive again with an antigen test 3 days ago and wants to know if she needs to keep testing until she is negative.   A&P: Improving with no focal lung findings indicative of pneumonia. No tachypnea, tachycardia, or hypoxia. - Supportive care - Return if worsening or continuing to feel fatigued

## 2021-04-30 NOTE — Progress Notes (Signed)
Subjective:   Patient ID: Andrea Long female   DOB: Oct 27, 1966 55 y.o.   MRN: [id number]  HPI: [name] is a 55 y.o. female with a past medical history as below who presents today for a routine follow up.    Please see problem based charting for more details.   Past Medical History:  Diagnosis Date   Dysmenorrhea    Endometriosis 2016   Plantar fasciitis of right foot    Current Outpatient Medications  Medication Sig Dispense Refill   amLODipine (NORVASC) 5 MG tablet Take 1 tablet (5 mg total) by mouth daily. 90 tablet 0   albuterol (PROVENTIL HFA;VENTOLIN HFA) 108 (90 Base) MCG/ACT inhaler Inhale 1-2 puffs into the lungs every 6 (six) hours as needed for wheezing or shortness of breath. 1 Inhaler 0   Ascorbic Acid (VITAMIN C) 1000 MG tablet Take 1,000 mg by mouth daily.     benzonatate (TESSALON PERLES) 100 MG capsule Take 1 capsule (100 mg total) by mouth 3 (three) times daily as needed for cough (cough). 20 capsule 0   calcium-vitamin D (OSCAL 500/200 D-3) 500-200 MG-UNIT tablet Take 1 tablet by mouth 2 (two) times daily. 180 tablet 3   cetirizine (ZYRTEC) 10 MG tablet Take 1 tablet (10 mg total) by mouth daily. 30 tablet 0   hydrochlorothiazide (HYDRODIURIL) 25 MG tablet TAKE 1 TABLET (25 MG TOTAL) BY MOUTH DAILY. 30 tablet 2   levonorgestrel (MIRENA, 52 MG,) 20 MCG/24HR IUD Mirena 20 mcg/24 hours (5 yrs) 52 mg intrauterine device  Take 1 device by intrauterine route.     Spacer/Aero-Holding Chambers (AEROCHAMBER PLUS) inhaler Use as instructed 1 each 2   No current facility-administered medications for this visit.   Family History  Problem Relation Age of Onset   Diabetes Mother    Social History   Socioeconomic History   Marital status: Married    Spouse name: Not on file   Number of children: Not on file   Years of education: Not on file   Highest education level: Not on file  Occupational History   Not on file  Tobacco Use   Smoking status:  Never   Smokeless tobacco: Never  Substance and Sexual Activity   Alcohol use: No   Drug use: No   Sexual activity: Yes    Birth control/protection: Surgical, I.U.D.  Other Topics Concern   Not on file  Social History Narrative   Not on file   Social Determinants of Health   Financial Resource Strain: Not on file  Food Insecurity: Not on file  Transportation Needs: Not on file  Physical Activity: Not on file  Stress: Not on file  Social Connections: Not on file   Review of Systems: Pertinent items noted in HPI and remainder of comprehensive ROS otherwise negative. Objective:  Physical Exam: Vitals:   04/30/21 1333  BP: (!) 150/81  Pulse: 83  Temp: 98.1 F (36.7 C)  TempSrc: Oral  SpO2: 99%  Weight: 175 lb 9.6 oz (79.7 kg)  Height: 5\' 1"  (1.549 m)    General: Well appearing and in no acute distress, appears stated age Neuro: A&O x4, normal affect HEENT: Normocephalic, atraumatic, PERRLA, EOMI, normal sclerae without scleral icterus or injection. Moist mucous membranes. Clear oropharynx. No goiter or palpable thyroid nodule. Cardiovascular: RRR, no m/r/g. Normal S1 and S2 without S3 or S4. Pulses 2+ in bilateral UE and LE. No peripheral edema Pulmonary: CTAB, no wheezes, rhonchi or rales. Normal WOB, no  clubbing  Skin: Warm and dry with no rashes, cuts, or bruises MSK: Normal ROM of all extremities. Strength 5/5 in bilateral UE and LE   Assessment & Plan:  Please see problem based charting for more details.

## 2021-04-30 NOTE — Assessment & Plan Note (Addendum)
Performed pap smear today. Placed order for mammogram. Discussed Shingles vaccine and recommended waiting to get the vaccine until she has recovered completely from her COVID infection. Hepatitis C screening done.

## 2021-04-30 NOTE — Assessment & Plan Note (Addendum)
She had been taking hydrochlorothiazide 25 mg but ran out in January and has not taken anything since then. Blood pressures at home have ranged from 428J-681L systolic but since having COVID they have been in the range of 572I systolic. She has been taking aspirin and garlic to control the hypertension.   A&P: Uncontrolled, BP today 150/81 - BMP today - Start amlodipine 5 mg  - Follow up in 1 month

## 2021-05-01 LAB — BMP8+ANION GAP
Anion Gap: 20 mmol/L — ABNORMAL HIGH (ref 10.0–18.0)
BUN/Creatinine Ratio: 23 (ref 9–23)
BUN: 21 mg/dL (ref 6–24)
CO2: 22 mmol/L (ref 20–29)
Calcium: 11.2 mg/dL — ABNORMAL HIGH (ref 8.7–10.2)
Chloride: 98 mmol/L (ref 96–106)
Creatinine, Ser: 0.9 mg/dL (ref 0.57–1.00)
Glucose: 205 mg/dL — ABNORMAL HIGH (ref 65–99)
Potassium: 3.8 mmol/L (ref 3.5–5.2)
Sodium: 140 mmol/L (ref 134–144)
eGFR: 75 mL/min/{1.73_m2} (ref >59)

## 2021-05-01 LAB — HEPATITIS C ANTIBODY: Hep C Virus Ab: <0.1 s/co ratio (ref 0.0–0.9)

## 2021-05-01 NOTE — Progress Notes (Signed)
Internal Medicine Clinic Attending  Case discussed with Dr. Collene Gobble  and MS3 Doshi at the time of the visit.  We reviewed the resident's history and exam and pertinent patient test results.  I agree with the assessment, diagnosis, and plan of care documented in the resident's note.

## 2021-05-01 NOTE — Assessment & Plan Note (Signed)
During routine lab work today, found to have hypercalcemia at 11.2. During previous visits, calcium was normal. She is scheduled for follow-up visit in one month. At that time will need to repeat lab work and add PTH. I also encouraged her to follow-up with the thyroid ultrasound once she recovers from COVID-19. - Follow-up CMP, PTH at next visit - Follow-up thyroid ultrasound

## 2021-05-03 LAB — CYTOLOGY - PAP
Adequacy: ABSENT
Comment: NEGATIVE
Diagnosis: NEGATIVE
High risk HPV: NEGATIVE

## 2021-05-26 ENCOUNTER — Encounter: Payer: Self-pay | Admitting: *Deleted

## 2021-05-28 ENCOUNTER — Other Ambulatory Visit (HOSPITAL_COMMUNITY): Payer: Self-pay

## 2021-06-08 ENCOUNTER — Ambulatory Visit (HOSPITAL_COMMUNITY): Admission: RE | Admit: 2021-06-08 | Payer: Self-pay | Source: Ambulatory Visit

## 2021-06-29 ENCOUNTER — Other Ambulatory Visit (HOSPITAL_COMMUNITY): Payer: Self-pay

## 2021-07-15 ENCOUNTER — Ambulatory Visit
Admission: RE | Admit: 2021-07-15 | Discharge: 2021-07-15 | Disposition: A | Discharge status: Home / Self Care | Payer: PRIVATE HEALTH INSURANCE | Source: Ambulatory Visit | Attending: Internal Medicine | Admitting: Internal Medicine

## 2021-07-15 ENCOUNTER — Other Ambulatory Visit: Payer: Self-pay

## 2021-07-15 DIAGNOSIS — Z0000 Encounter for general adult medical examination without abnormal findings: Secondary | ICD-10-CM

## 2021-07-28 ENCOUNTER — Other Ambulatory Visit: Payer: Self-pay | Admitting: Student

## 2021-07-28 DIAGNOSIS — I10 Essential (primary) hypertension: Secondary | ICD-10-CM

## 2021-07-29 ENCOUNTER — Other Ambulatory Visit (HOSPITAL_COMMUNITY): Payer: Self-pay

## 2021-07-29 MED ORDER — AMLODIPINE BESYLATE 5 MG PO TABS
5.0000 mg | ORAL_TABLET | Freq: Every day | ORAL | 0 refills | Status: DC
  Filled 2021-07-29: qty 30, 30d supply, fill #0
  Filled 2021-09-01: qty 30, 30d supply, fill #1
  Filled 2021-10-02: qty 30, 30d supply, fill #2

## 2021-09-01 ENCOUNTER — Other Ambulatory Visit (HOSPITAL_COMMUNITY): Payer: Self-pay

## 2021-10-02 ENCOUNTER — Other Ambulatory Visit (HOSPITAL_COMMUNITY): Payer: Self-pay

## 2021-10-29 ENCOUNTER — Other Ambulatory Visit: Payer: Self-pay | Admitting: Internal Medicine

## 2021-10-29 DIAGNOSIS — I10 Essential (primary) hypertension: Secondary | ICD-10-CM

## 2021-10-30 ENCOUNTER — Other Ambulatory Visit (HOSPITAL_COMMUNITY): Payer: Self-pay

## 2021-10-30 MED ORDER — AMLODIPINE BESYLATE 5 MG PO TABS
5.0000 mg | ORAL_TABLET | Freq: Every day | ORAL | 3 refills | Status: DC
  Filled 2021-10-30: qty 30, 30d supply, fill #0
  Filled 2021-12-02: qty 30, 30d supply, fill #1

## 2021-12-02 ENCOUNTER — Other Ambulatory Visit (HOSPITAL_COMMUNITY): Payer: Self-pay

## 2021-12-29 ENCOUNTER — Ambulatory Visit (INDEPENDENT_AMBULATORY_CARE_PROVIDER_SITE_OTHER): Payer: BC Managed Care – PPO | Admitting: Internal Medicine

## 2021-12-29 ENCOUNTER — Other Ambulatory Visit: Payer: Self-pay

## 2021-12-29 ENCOUNTER — Other Ambulatory Visit (HOSPITAL_COMMUNITY): Payer: Self-pay

## 2021-12-29 VITALS — BP 137/76 | HR 79 | Temp 98.5°F | Ht 61.0 in | Wt 176.1 lb

## 2021-12-29 DIAGNOSIS — R040 Epistaxis: Secondary | ICD-10-CM | POA: Diagnosis not present

## 2021-12-29 DIAGNOSIS — I10 Essential (primary) hypertension: Secondary | ICD-10-CM

## 2021-12-29 LAB — APTT: aPTT: 28 seconds (ref 24–36)

## 2021-12-29 LAB — CBC
HCT: 41.4 % (ref 36.0–46.0)
Hemoglobin: 13.8 g/dL (ref 12.0–15.0)
MCH: 30.3 pg (ref 26.0–34.0)
MCHC: 33.3 g/dL (ref 30.0–36.0)
MCV: 90.8 fL (ref 80.0–100.0)
Platelets: 270 K/uL (ref 150–400)
RBC: 4.56 MIL/uL (ref 3.87–5.11)
RDW: 13.2 % (ref 11.5–15.5)
WBC: 5.7 K/uL (ref 4.0–10.5)
nRBC: 0 % (ref 0.0–0.2)

## 2021-12-29 LAB — PROTIME-INR
INR: 1 (ref 0.8–1.2)
Prothrombin Time: 13.1 s (ref 11.4–15.2)

## 2021-12-29 MED ORDER — OXYMETAZOLINE HCL 0.05 % NA SOLN
1.0000 | Freq: Two times a day (BID) | NASAL | 0 refills | Status: AC
  Filled 2021-12-29: qty 30, 150d supply, fill #0

## 2021-12-29 MED ORDER — HYDROCHLOROTHIAZIDE 25 MG PO TABS
25.0000 mg | ORAL_TABLET | Freq: Every day | ORAL | 2 refills | Status: AC
  Filled 2021-12-29: qty 30, 30d supply, fill #0
  Filled 2022-02-01: qty 30, 30d supply, fill #1
  Filled 2022-03-01: qty 30, 30d supply, fill #2

## 2021-12-29 MED ORDER — AMLODIPINE BESYLATE 5 MG PO TABS
5.0000 mg | ORAL_TABLET | Freq: Every day | ORAL | 3 refills | Status: AC
  Filled 2021-12-29: qty 30, 30d supply, fill #0
  Filled 2022-02-01: qty 30, 30d supply, fill #1
  Filled 2022-03-01: qty 30, 30d supply, fill #2
  Filled 2022-03-26: qty 30, 30d supply, fill #3
  Filled 2022-05-13: qty 30, 30d supply, fill #4

## 2021-12-29 NOTE — Progress Notes (Signed)
° °  CC: nose bleeds  HPI:  Ms.Zoi L Shimada is a 56 y.o. PMH noted below, who presents to the [facility] with complaints of nose bleeds. To see the management of his acute and chronic conditions, please refer to the A&P note under the encounters tab.   Past Medical History:  Diagnosis Date   Dysmenorrhea    Endometriosis 2016   Plantar fasciitis of right foot    Review of Systems:  negative for fever, chills, cough, runny nose, sore throat, history of easy bleeding or bruising, lightheadedness, or dizziness  Physical Exam: Gen: WNWD in NAD HEENT: normocephalic atraumatic, bleeding from the left nasal passage with no visible clots CV: RRR Resp: normal WOB  GI: soft, nontender MSK: moves all extremities without difficulty Skin:warm and dry Neuro:alert answering questions appropriately, normal gait Psych: normal affect   Assessment & Plan:   See Encounters Tab for problem based charting.  Patient seen with Dr. Philipp Ovens

## 2021-12-29 NOTE — Assessment & Plan Note (Addendum)
Patient describes a nose bleed that has been happening on and off since yesterday. She says it started suddenly and denies any trauma to the area, other allergy or cold symptoms. Does not have a personal or family history of heavy bleeding. Will try conservative measures with afrin, tamponading, and saline nasal rinses to prevent dehydration.  Plan - ENT referral - afrin, neti pot, tamponade as needed - f/u PTT, PT/INR, and platelets  Addendum: PT, PTT, and platelets WNL. Patient says that bleeding has stopped after use of nasal spray.

## 2021-12-29 NOTE — Assessment & Plan Note (Addendum)
Blood pressure 137/76 today in the setting of acute nose bleed which is likely distressing for the patient. Will continue her amlodipine at the current dose and adjust medications at the next visit if BP is still above 130. - continue amlodipine 5 and HCTZ 25

## 2021-12-29 NOTE — Patient Instructions (Signed)
Andrea Long  It was a pleasure seeing you in the clinic today.   We talked about your nose bleeds and refilled your blood pressure medications.  Nose bleeds- we are going to refer you to the ear nose and throat doctors. If your nose starts to bleed lean your head forward and pinch your nostrils to help the blood clot. You can also use a tampon or a cotton swab. You can use Afrin over the counter nasal spray to help stop the bleeding. Using a neti pot or a saline rinse can help keep your nose hydrated and prevent further episodes of bleeding.  Please call our clinic at [phone] if you have any questions or concerns. The best time to call is Monday-Friday from 9am-4pm, but there is someone available 24/7 at the same number. If you need medication refills, please notify your pharmacy one week in advance and they will send [place] a request.   Thank you for letting us take part in your care. We look forward to seeing you next time!

## 2021-12-29 NOTE — Addendum Note (Signed)
Addended by: Inda Coke on: 12/29/2021 03:08 PM   Modules accepted: Orders

## 2022-01-01 ENCOUNTER — Other Ambulatory Visit: Payer: Self-pay

## 2022-01-01 ENCOUNTER — Encounter (HOSPITAL_COMMUNITY): Payer: Self-pay | Admitting: Emergency Medicine

## 2022-01-01 ENCOUNTER — Emergency Department (HOSPITAL_COMMUNITY)
Admission: EM | Admit: 2022-01-01 | Discharge: 2022-01-01 | Disposition: A | Discharge status: Home / Self Care | Payer: BC Managed Care – PPO | Attending: Emergency Medicine | Admitting: Emergency Medicine

## 2022-01-01 DIAGNOSIS — R040 Epistaxis: Secondary | ICD-10-CM | POA: Diagnosis present

## 2022-01-01 HISTORY — DX: Essential (primary) hypertension: I10

## 2022-01-01 MED ORDER — LIDOCAINE HCL 4 % EX SOLN: Freq: Once | CUTANEOUS | Status: DC

## 2022-01-01 MED ORDER — LIDOCAINE HCL 4 % EX SOLN
Freq: Once | CUTANEOUS | Status: AC
  Filled 2022-01-01: qty 50

## 2022-01-01 NOTE — ED Triage Notes (Signed)
Pt. Stated, I started having some nosebleeds about 3 days ago and I went o clinic an they gave me some spray Afrin but then I had nosebleed during the night.

## 2022-01-01 NOTE — ED Provider Notes (Signed)
[facility]ENC[facility] DEPARTMENT Provider Note   CSN: [id number] Arrival date & time: 01/01/22  0725     History  Chief Complaint  Patient presents with   Epistaxis    Andrea Long is a 56 y.o. female.  HPI 56 year old female presents with nosebleeding.  History is from patient and husband.  Husband reports that they went to do karaoke on 2/5 and then they both had some sore throats that they think were from singing.  Shortly thereafter patient has had on and off nosebleeding.  She states it is from the left nare.  No trauma, URI symptoms, or history of bleeding/blood thinner use.  Currently she is not bleeding.  She saw her PCP on 2/7 and just got the Afrin yesterday.  She has used it twice.  However she has had on and off bleeding including last night.  It scares her when is bleeding though it never last more than 10 minutes.  She denies any dizziness/lightheadedness.  She was referred to ENT but when she called they stated she needed to come through the emergency department for referral.  Home Medications Prior to Admission medications   Medication Sig Start Date End Date Taking? Authorizing Provider  albuterol (PROVENTIL HFA;VENTOLIN HFA) 108 (90 Base) MCG/ACT inhaler Inhale 1-2 puffs into the lungs every 6 (six) hours as needed for wheezing or shortness of breath. 01/18/19   Melynda Ripple, MD  amLODipine (NORVASC) 5 MG tablet Take 1 tablet (5 mg total) by mouth daily. 12/29/21   Demaio, Alexa, MD  Ascorbic Acid (VITAMIN C) 1000 MG tablet Take 1,000 mg by mouth daily.    [provider]  benzonatate (TESSALON PERLES) 100 MG capsule Take 1 capsule (100 mg total) by mouth 3 (three) times daily as needed for cough (cough). 04/21/21   Muthersbaugh, Jarrett Soho, PA-C  calcium-vitamin D (OSCAL 500/200 D-3) 500-200 MG-UNIT tablet Take 1 tablet by mouth 2 (two) times daily. 08/14/18 08/14/19  Lorella Nimrod, MD  cetirizine (ZYRTEC) 10 MG tablet Take 1 tablet (10 mg  total) by mouth daily. 03/29/18   Zigmund Gottron, NP  hydrochlorothiazide (HYDRODIURIL) 25 MG tablet Take 1 tablet (25 mg total) by mouth daily. 12/29/21   Demaio, Alexa, MD  levonorgestrel (MIRENA, 52 MG,) 20 MCG/24HR IUD Mirena 20 mcg/24 hours (5 yrs) 52 mg intrauterine device  Take 1 device by intrauterine route.    [provider]  oxymetazoline (AFRIN NASAL SPRAY) 0.05 % nasal spray Place 1 spray into both nostrils 2 (two) times daily. 12/29/21   Scarlett Presto, MD  Spacer/Aero-Holding Chambers (AEROCHAMBER PLUS) inhaler Use as instructed 01/18/19   Melynda Ripple, MD      Allergies    Patient has no known allergies.    Review of Systems   Review of Systems  HENT:  Positive for nosebleeds.   Neurological:  Negative for dizziness and light-headedness.   Physical Exam Updated Vital Signs BP 123/80    Pulse 65    Temp 98.2 F (36.8 C) (Oral)    Resp 14    Ht 5\' 1"  (1.549 m)    Wt 79.8 kg    SpO2 99%    BMI 33.25 kg/m  Physical Exam Vitals and nursing note reviewed.  Constitutional:      Appearance: She is well-developed.  HENT:     Head: Normocephalic and atraumatic.     Nose:     Comments: No obvious abnormalities to the left nare.  There is a mildly friable  tissue to the right anterior/lateral nare.  Turbinates on both sides appear swollen. Pulmonary:     Effort: Pulmonary effort is normal.  Abdominal:     General: There is no distension.  Skin:    General: Skin is warm and dry.  Neurological:     Mental Status: She is alert.    ED Results / Procedures / Treatments   Labs (all labs ordered are listed, but only abnormal results are displayed) Labs Reviewed - No data to display  EKG None  Radiology No results found.  Procedures .Epistaxis Management  Date/Time: 01/01/2022 10:59 AM Performed by: Sherwood Gambler, MD Authorized by: Sherwood Gambler, MD   Consent:    Consent obtained:  Verbal   Consent given by:  Patient Anesthesia:    Anesthesia method:   Topical application   Topical anesthetic:  Lidocaine gel Procedure details:    Treatment site:  R anterior   Treatment method:  Silver nitrate   Treatment complexity:  Limited   Treatment episode: recurring   Post-procedure details:    Procedure completion:  Tolerated well, no immediate complications    Medications Ordered in ED Medications  lidocaine (XYLOCAINE) 4 % external solution ( Topical Given 01/01/22 1016)    ED Course/ Medical Decision Making/ A&P                            There appears to be a friable area of tissue in the right anterior nare.  This is actually on the lateral aspect.  Is not currently bleeding but I wonder if this is the area of bleeding is I actually do not see anything on the left side despite her having recurrent left-sided bleeding.  It is also possible that is posterior and I cannot see it.  Thus after discussion with husband and patient, some silver nitrate was applied after some topical lidocaine.  Labs from her PCP visit from a couple days ago were reviewed and she has normal PT/INR/PTT/platelets.  Normal hemoglobin.  Vital signs are normal so I do not think repeat labs are needed.  No acute imaging needed.  Will refer to ENT.        Final Clinical Impression(s) / ED Diagnoses Final diagnoses:  Epistaxis    Rx / DC Orders ED Discharge Orders     None         Sherwood Gambler, MD 01/01/22 1100

## 2022-01-02 ENCOUNTER — Emergency Department (HOSPITAL_COMMUNITY)
Admission: EM | Admit: 2022-01-02 | Discharge: 2022-01-02 | Disposition: A | Discharge status: Home / Self Care | Payer: BC Managed Care – PPO | Attending: Emergency Medicine | Admitting: Emergency Medicine

## 2022-01-02 ENCOUNTER — Other Ambulatory Visit: Payer: Self-pay

## 2022-01-02 ENCOUNTER — Encounter (HOSPITAL_COMMUNITY): Payer: Self-pay | Admitting: Emergency Medicine

## 2022-01-02 DIAGNOSIS — Z79899 Other long term (current) drug therapy: Secondary | ICD-10-CM | POA: Diagnosis not present

## 2022-01-02 DIAGNOSIS — R040 Epistaxis: Secondary | ICD-10-CM | POA: Insufficient documentation

## 2022-01-02 LAB — CBC WITH DIFFERENTIAL/PLATELET
Abs Immature Granulocytes: 0.01 10*3/uL (ref 0.00–0.07)
Basophils Absolute: 0 10*3/uL (ref 0.0–0.1)
Basophils Relative: 1 %
Eosinophils Absolute: 0.3 10*3/uL (ref 0.0–0.5)
Eosinophils Relative: 5 %
HCT: 41.9 % (ref 36.0–46.0)
Hemoglobin: 13.9 g/dL (ref 12.0–15.0)
Immature Granulocytes: 0 %
Lymphocytes Relative: 30 %
Lymphs Abs: 1.5 10*3/uL (ref 0.7–4.0)
MCH: 30 pg (ref 26.0–34.0)
MCHC: 33.2 g/dL (ref 30.0–36.0)
MCV: 90.3 fL (ref 80.0–100.0)
Monocytes Absolute: 0.3 10*3/uL (ref 0.1–1.0)
Monocytes Relative: 6 %
Neutro Abs: 3 10*3/uL (ref 1.7–7.7)
Neutrophils Relative %: 58 %
Platelets: 281 10*3/uL (ref 150–400)
RBC: 4.64 MIL/uL (ref 3.87–5.11)
RDW: 13.1 % (ref 11.5–15.5)
WBC: 5.1 10*3/uL (ref 4.0–10.5)
nRBC: 0 % (ref 0.0–0.2)

## 2022-01-02 LAB — BASIC METABOLIC PANEL
Anion gap: 10 (ref 5–15)
BUN: 13 mg/dL (ref 6–20)
CO2: 26 mmol/L (ref 22–32)
Calcium: 9.5 mg/dL (ref 8.9–10.3)
Chloride: 100 mmol/L (ref 98–111)
Creatinine, Ser: 0.85 mg/dL (ref 0.44–1.00)
GFR, Estimated: >60 mL/min (ref >60)
Glucose, Bld: 252 mg/dL — ABNORMAL HIGH (ref 70–99)
Potassium: 3.3 mmol/L — ABNORMAL LOW (ref 3.5–5.1)
Sodium: 136 mmol/L (ref 135–145)

## 2022-01-02 LAB — PROTIME-INR
INR: 0.9 (ref 0.8–1.2)
Prothrombin Time: 12.6 seconds (ref 11.4–15.2)

## 2022-01-02 NOTE — ED Triage Notes (Signed)
Pt to triage via [facility].  Reports nosebleeds x 1 week.  Seen in ED yesterday and states L nare started bleeding again around 3am.  Used Afrin 1 hour before EMS arrival and they gave 2 sprays of Afrin without improvement.  EMS- 202/116 No blood thinners.

## 2022-01-02 NOTE — ED Provider Triage Note (Signed)
Emergency Medicine Provider Triage Evaluation Note  Andrea Long , a 56 y.o. female  was evaluated in triage.  Pt complains of left sided nosebleed. Nosebleed has been intermittently for the past week. Typically occurs from left nare. This nosebleed started at [place] with no relief. See in the ED yesterday where no abnormalities were seen in L nare; however some friable area to right nare which required silver nitrate. No blood thinners.   Review of Systems  Positive: epistaxis Negative: SOB  Physical Exam  BP (!) 148/88    Pulse 91    Temp 98.6 F (37 C) (Oral)    Resp 18    SpO2 96%  Gen:   Awake, no distress   Resp:  Normal effort  MSK:   Moves extremities without difficulty  Other:    Medical Decision Making  Medically screening exam initiated at 10:20 AM.  Appropriate orders placed.  Andrea Long was informed that the remainder of the evaluation will be completed by another provider, this initial triage assessment does not replace that evaluation, and the importance of remaining in the ED until their evaluation is complete.  Repeat labs to check hemoglobin, PT/INR   Suzy Bouchard, PA-C 01/02/22 1023

## 2022-01-02 NOTE — ED Provider Notes (Signed)
[facility] EMERGENCY DEPARTMENT Provider Note   CSN: [id number] Arrival date & time: 01/02/22  1007     History  Chief Complaint  Patient presents with   Epistaxis    Andrea Long is a 56 y.o. female who presents to the ED today via EMS for epistaxis intermittently x 1 week.  Seen initially by PCP and prescribed Afrin spray which she has been using . Seen in the ED yesterday for same.  Complaining of bleeding only from the left nostril.  No obvious source appreciated at left nare however she did have some mild friable tissue to the right nare.  Silver nitrate applied to the area of friability and patient was discharged home with plans to follow-up with ENT.  She states that throughout the night she has had intermittent episodes of continuous bleeding from the left nare.  She states that it would last approximately 10 minutes however dissipates with leaning forward and applying pressure to the bridge of her nose.  Reports that due to continued episodes she called 911.  She was provided with additional Afrin spray with EMS and brought here for further evaluation.   The history is provided by the patient and the spouse.      Home Medications Prior to Admission medications   Medication Sig Start Date End Date Taking? Authorizing Provider  albuterol (PROVENTIL HFA;VENTOLIN HFA) 108 (90 Base) MCG/ACT inhaler Inhale 1-2 puffs into the lungs every 6 (six) hours as needed for wheezing or shortness of breath. 01/18/19   Melynda Ripple, MD  amLODipine (NORVASC) 5 MG tablet Take 1 tablet (5 mg total) by mouth daily. 12/29/21   Demaio, Alexa, MD  Ascorbic Acid (VITAMIN C) 1000 MG tablet Take 1,000 mg by mouth daily.    [provider]  benzonatate (TESSALON PERLES) 100 MG capsule Take 1 capsule (100 mg total) by mouth 3 (three) times daily as needed for cough (cough). 04/21/21   Muthersbaugh, [place], PA-C  calcium-vitamin D (OSCAL 500/200 D-3) 500-200 MG-UNIT tablet Take  1 tablet by mouth 2 (two) times daily. 08/14/18 08/14/19  Lorella Nimrod, MD  cetirizine (ZYRTEC) 10 MG tablet Take 1 tablet (10 mg total) by mouth daily. 03/29/18   Zigmund Gottron, NP  hydrochlorothiazide (HYDRODIURIL) 25 MG tablet Take 1 tablet (25 mg total) by mouth daily. 12/29/21   Demaio, Alexa, MD  levonorgestrel (MIRENA, 52 MG,) 20 MCG/24HR IUD Mirena 20 mcg/24 hours (5 yrs) 52 mg intrauterine device  Take 1 device by intrauterine route.    [provider]  oxymetazoline (AFRIN NASAL SPRAY) 0.05 % nasal spray Place 1 spray into both nostrils 2 (two) times daily. 12/29/21   Scarlett Presto, MD  Spacer/Aero-Holding Chambers (AEROCHAMBER PLUS) inhaler Use as instructed 01/18/19   Melynda Ripple, MD      Allergies    Patient has no known allergies.    Review of Systems   Review of Systems  Constitutional:  Negative for chills and fever.  HENT:  Positive for nosebleeds.   Neurological:  Negative for dizziness and light-headedness.  Hematological:  Does not bruise/bleed easily.  All other systems reviewed and are negative.  Physical Exam Updated Vital Signs BP 132/85 (BP Location: Right Arm)    Pulse 84    Temp 98.6 F (37 C) (Oral)    Resp 18    SpO2 99%  Physical Exam Vitals and nursing note reviewed.  Constitutional:      Appearance: She is not ill-appearing.  HENT:  Head: Normocephalic and atraumatic.     Nose:     Comments: No bleeding appreciated at this time. Bilateral nasal polyps appreciated at this time.     Mouth/Throat:     Comments: Posterior oropharynx clear without signs of bleeding  Eyes:     Conjunctiva/sclera: Conjunctivae normal.  Cardiovascular:     Rate and Rhythm: Normal rate and regular rhythm.  Pulmonary:     Effort: Pulmonary effort is normal.     Breath sounds: Normal breath sounds.  Abdominal:     Palpations: Abdomen is soft.     Tenderness: There is no abdominal tenderness.  Musculoskeletal:     Cervical back: Neck supple.  Skin:     General: Skin is warm and dry.  Neurological:     Mental Status: She is alert.    ED Results / Procedures / Treatments   Labs (all labs ordered are listed, but only abnormal results are displayed) Labs Reviewed  BASIC METABOLIC PANEL - Abnormal; Notable for the following components:      Result Value   Potassium 3.3 (*)    Glucose, Bld 252 (*)    All other components within normal limits  CBC WITH DIFFERENTIAL/PLATELET  PROTIME-INR    EKG None  Radiology No results found.  Procedures .Epistaxis Management  Date/Time: 01/02/2022 1:15 PM Performed by: Eustaquio Maize, PA-C Authorized by: Eustaquio Maize, PA-C   Consent:    Consent obtained:  Verbal   Consent given by:  Patient and spouse   Alternatives discussed:  No treatment, referral and observation Universal protocol:    Patient identity confirmed:  Verbally with patient Procedure details:    Treatment site:  L anterior   Treatment method:  Silver nitrate   Treatment complexity:  Limited   Treatment episode: initial   Post-procedure details:    Assessment:  Bleeding stopped   Procedure completion:  Tolerated well, no immediate complications    Medications Ordered in ED Medications - No data to display  ED Course/ Medical Decision Making/ A&P                           Medical Decision Making 56 year old female who presents to the ED today for recurrent episodes of epistaxis.  Received Afrin spray with EMS.  On arrival vitals are stable.  Patient was medically screened and work-up started including CBC, BMP, INR.  All labs have returned unremarkable at this time and hemoglobin stable at 13.9.  She was seen in the ED yesterday for same.  No obvious active bleeding however did have a area of friability to the right nare and silver nitrate applied to this area.  On exam she is currently not bleeding.  There is no sign of anterior bleeding currently.  Her posterior oropharynx is clear without bleeding.  Difficult to  assess whether patient is having posterior bleed that is not currently visualized.  Given she is not actively bleeding at this time will monitor in the ED.  If she continues to bleed will do further examination however otherwise it does sound like the bleeding ceases with direct pressure is a good sign.  Given she is not actively bleeding I do not feel she needs a posterior nasal packing at this time however again we will continue to monitor.  Ultimately patient will need to follow-up with ear nose and throat.   On reevaluation pt with small active bleeding appreciated to anterior aspect of L naris. Silver  nitrate stick applied. Pt monitored in the ED for over 30 minutes without additional bleeding. She is stable for discharge at this time. Recommend continued follow up with ENT. Had lengthy discussion with pt regarding nose bleeds/how to stop them. She is encouraged that if the bleeding is able to be stopped at home with direct pressure that she can follow up in the outpatient setting.    Problems Addressed: Anterior epistaxis: acute illness or injury  Amount and/or Complexity of Data Reviewed Independent Historian: spouse          Final Clinical Impression(s) / ED Diagnoses Final diagnoses:  Anterior epistaxis    Rx / DC Orders ED Discharge Orders     None        Discharge Instructions      Please follow up with ENT Dr. Benjamine Mola for further evaluation  Attached are additional instructions on nosebleeds. If you bleed again apply pressure to the bridge of your nose and hold directly for 10-20 minutes. If unable to stop the bleeding completely after 20 minutes you should return to the ED for further evaluation.        Eustaquio Maize, PA-C 01/02/22 1350    Dorie Rank, MD 01/03/22 [phone]

## 2022-01-02 NOTE — Discharge Instructions (Signed)
Please follow up with ENT Dr. Benjamine Mola for further evaluation  Attached are additional instructions on nosebleeds. If you bleed again apply pressure to the bridge of your nose and hold directly for 10-20 minutes. If unable to stop the bleeding completely after 20 minutes you should return to the ED for further evaluation.

## 2022-01-03 ENCOUNTER — Emergency Department (HOSPITAL_COMMUNITY)
Admission: EM | Admit: 2022-01-03 | Discharge: 2022-01-03 | Disposition: A | Discharge status: Home / Self Care | Payer: BC Managed Care – PPO | Attending: Emergency Medicine | Admitting: Emergency Medicine

## 2022-01-03 ENCOUNTER — Encounter (HOSPITAL_COMMUNITY): Payer: Self-pay

## 2022-01-03 ENCOUNTER — Other Ambulatory Visit: Payer: Self-pay

## 2022-01-03 DIAGNOSIS — R040 Epistaxis: Secondary | ICD-10-CM | POA: Diagnosis present

## 2022-01-03 MED ORDER — OXYMETAZOLINE HCL 0.05 % NA SOLN
1.0000 | Freq: Two times a day (BID) | NASAL | 0 refills | Status: AC
Start: 2022-01-03 — End: ?
  Filled 2022-01-03: qty 30, 150d supply, fill #0

## 2022-01-03 NOTE — ED Provider Triage Note (Signed)
Emergency Medicine Provider Triage Evaluation Note  Andrea Long , a 56 y.o. female  was evaluated in triage.  Pt complains of ongoing nosebleeding.  Bleeding is from the left nare.  She was seen by her primary care doctor who advised Afrin.  She then had 2 ED visits where silver nitrate was applied.  Bleeding was improved.  However patient states that she goes home and the bleeding starts again.  She denies anticoagulation.  She has a referral for ENT but has not followed up as of yet.  Review of Systems  Positive: Nosebleeding Negative: Syncope  Physical Exam  BP 136/89 (BP Location: Right Arm)    Pulse 84    Temp 98.2 F (36.8 C) (Oral)    Resp 16    Ht 5\' 1"  (1.549 m)    Wt 79.4 kg    SpO2 97%    BMI 33.07 kg/m  Gen:   Awake, no distress   Resp:  Normal effort  MSK:   Moves extremities without difficulty  Other:  Patient with gauze in left nare with blood noted  Medical Decision Making  Medically screening exam initiated at 8:44 PM.  Appropriate orders placed.  Andrea Long was informed that the remainder of the evaluation will be completed by another provider, this initial triage assessment does not replace that evaluation, and the importance of remaining in the ED until their evaluation is complete.     Carlisle Cater, PA-C 01/03/22 2045

## 2022-01-03 NOTE — ED Provider Notes (Signed)
[facility] DEPT Provider Note   CSN: [id number] Arrival date & time: 01/03/22  2015     History  Chief Complaint  Patient presents with   Epistaxis    Andrea Long is a 56 y.o. female who presents to the ED due to epistaxis that has been occurring intermittently for the past week.  Patient seen by PCP and prescribed Afrin.  Patient seen in the ED yesterday and the previous day where silver nitrate was placed in both naris.  Patient states she had another nosebleed earlier today and reported to the ED for further evaluation.  Patient notes Afrin has not been working.  Bleeding typically occurs from left nare. Patient states  she used her husband's nasal decongestant spray and is requesting a prescription for her own.  Patient has an appointment with ENT the end of February.  Patient is not currently any blood thinners.  Patient's nosebleed has resolved prior to ED evaluation.     Home Medications Prior to Admission medications   Medication Sig Start Date End Date Taking? Authorizing Provider  oxymetazoline (NASAL DECONGESTANT SPRAY) 0.05 % nasal spray Place 1 spray into both nostrils 2 (two) times daily. 01/03/22  Yes Angelina Venard, Druscilla Brownie, PA-C  albuterol (PROVENTIL HFA;VENTOLIN HFA) 108 (90 Base) MCG/ACT inhaler Inhale 1-2 puffs into the lungs every 6 (six) hours as needed for wheezing or shortness of breath. 01/18/19   Melynda Ripple, MD  amLODipine (NORVASC) 5 MG tablet Take 1 tablet (5 mg total) by mouth daily. 12/29/21   Demaio, Alexa, MD  Ascorbic Acid (VITAMIN C) 1000 MG tablet Take 1,000 mg by mouth daily.    [provider]  benzonatate (TESSALON PERLES) 100 MG capsule Take 1 capsule (100 mg total) by mouth 3 (three) times daily as needed for cough (cough). 04/21/21   Muthersbaugh, Jarrett Soho, PA-C  calcium-vitamin D (OSCAL 500/200 D-3) 500-200 MG-UNIT tablet Take 1 tablet by mouth 2 (two) times daily. 08/14/18 08/14/19  Lorella Nimrod, MD   cetirizine (ZYRTEC) 10 MG tablet Take 1 tablet (10 mg total) by mouth daily. 03/29/18   Zigmund Gottron, NP  hydrochlorothiazide (HYDRODIURIL) 25 MG tablet Take 1 tablet (25 mg total) by mouth daily. 12/29/21   Demaio, Alexa, MD  levonorgestrel (MIRENA, 52 MG,) 20 MCG/24HR IUD Mirena 20 mcg/24 hours (5 yrs) 52 mg intrauterine device  Take 1 device by intrauterine route.    [provider]  oxymetazoline (AFRIN NASAL SPRAY) 0.05 % nasal spray Place 1 spray into both nostrils 2 (two) times daily. 12/29/21   Scarlett Presto, MD  Spacer/Aero-Holding Chambers (AEROCHAMBER PLUS) inhaler Use as instructed 01/18/19   Melynda Ripple, MD      Allergies    Patient has no known allergies.    Review of Systems   Review of Systems  HENT:  Positive for nosebleeds.    Physical Exam Updated Vital Signs BP 136/89 (BP Location: Right Arm)    Pulse 84    Temp 98.2 F (36.8 C) (Oral)    Resp 16    Ht 5\' 1"  (1.549 m)    Wt 79.4 kg    SpO2 97%    BMI 33.07 kg/m  Physical Exam Vitals and nursing note reviewed.  Constitutional:      General: She is not in acute distress.    Appearance: She is not ill-appearing.  HENT:     Head: Normocephalic.     Nose:     Comments: Dried blood is left nare. No  active bleeding. Nasal polyps bilaterally.     Mouth/Throat:     Comments: No bleeding in posterior oropharynx Eyes:     Pupils: Pupils are equal, round, and reactive to light.  Cardiovascular:     Rate and Rhythm: Normal rate and regular rhythm.     Pulses: Normal pulses.     Heart sounds: Normal heart sounds. No murmur heard.   No friction rub. No gallop.  Pulmonary:     Effort: Pulmonary effort is normal.     Breath sounds: Normal breath sounds.  Abdominal:     General: Abdomen is flat. There is no distension.     Palpations: Abdomen is soft.     Tenderness: There is no abdominal tenderness. There is no guarding or rebound.  Musculoskeletal:        General: Normal range of motion.     Cervical  back: Neck supple.  Skin:    General: Skin is warm and dry.  Neurological:     General: No focal deficit present.     Mental Status: She is alert.  Psychiatric:        Mood and Affect: Mood normal.        Behavior: Behavior normal.    ED Results / Procedures / Treatments   Labs (all labs ordered are listed, but only abnormal results are displayed) Labs Reviewed - No data to display  EKG None  Radiology No results found.  Procedures Procedures    Medications Ordered in ED Medications - No data to display  ED Course/ Medical Decision Making/ A&P                           Medical Decision Making Amount and/or Complexity of Data Reviewed Independent Historian: friend    Details: Daughter at bedside provided history External Data Reviewed: notes.    Details: PCP note   56 year old female presents to the ED due to ongoing nosebleeds that have been intermittent for the past week.  Patient has been evaluated by PCP and twice in the ED over the past few days.  During her ED visits, silver nitrate was placed in both nares.  Upon ED arrival, nosebleed had resolved.  Patient requesting a prescription for nasal decongestant spray because she notes it has been working for nosebleeds instead of Afrin.  Patient had labs performed yesterday at [facility] ED.  At that time, her hemoglobin was stable at 13.9.  Patient is not currently on any blood thinners.  Upon arrival, stable vitals.  Patient in no acute distress.  Dried blood in left nare.  No active bleeding.  No bleeding in posterior oropharynx.  Will hold on further silver nitrate at this time given no active bleeding.  Patient discharged with nasal decongestant spray.  I had a long discussion with patient and her daughter at bedside regarding need to follow-up with ENT sooner.  ENT number given at discharge. Strict ED precautions discussed with patient. Patient states understanding and agrees to plan. Patient discharged home in no acute  distress and stable vitals        Final Clinical Impression(s) / ED Diagnoses Final diagnoses:  Epistaxis    Rx / DC Orders ED Discharge Orders          Ordered    oxymetazoline (NASAL DECONGESTANT SPRAY) 0.05 % nasal spray  2 times daily        01/03/22 2239  Karie Kirks 01/03/22 2242    Luna Fuse, MD 01/10/22 [phone]

## 2022-01-03 NOTE — Discharge Instructions (Signed)
It was a pleasure taking care of you today. As discussed, you need to see an ENT this week for further evaluation. I have included the ENT doctor on call tonight. Call tomorrow to schedule an appointment for further evaluation. I am sending you home with nasal spray. Use as needed. Follow-up with PCP if symptoms do not improve over the next week. Return to the ER for new or worsening symptoms.

## 2022-01-03 NOTE — ED Triage Notes (Signed)
Pt states that she has had a nose bleed a 1 week. Pt states that the afrin nose spray is not working. Nose is not currently bleeding.

## 2022-01-04 ENCOUNTER — Other Ambulatory Visit (HOSPITAL_COMMUNITY): Payer: Self-pay

## 2022-01-07 NOTE — Progress Notes (Signed)
Internal Medicine Clinic Attending  I saw and evaluated the patient.  I personally confirmed the key portions of the history and exam documented by Dr. DeMaio   and I reviewed pertinent patient test results.  The assessment, diagnosis, and plan were formulated together and I agree with the documentation in the resident's note.  

## 2022-01-14 ENCOUNTER — Ambulatory Visit (INDEPENDENT_AMBULATORY_CARE_PROVIDER_SITE_OTHER): Payer: BC Managed Care – PPO | Admitting: Internal Medicine

## 2022-01-14 ENCOUNTER — Other Ambulatory Visit: Payer: Self-pay

## 2022-01-14 DIAGNOSIS — R040 Epistaxis: Secondary | ICD-10-CM | POA: Diagnosis not present

## 2022-01-14 NOTE — Assessment & Plan Note (Signed)
Patient following up for FMLA paperwork for her epistaxis. She was initially evaluated on 2/8 for one day of epistaxis. At the time, patient was prescribed afrin spray and recommended for ENT referral. Following this, patient had multiple ED visits for epistaxis until she was evaluated by ENT and underwent cauterization with silver nitrate on 2/14. She did note one episode of epistaxis on 2/15 but none since then. She has returned to work on 2/16 and reports feeling well. She is requesting FMLA paperwork be filled for the duration she missed work.  This was filled out and faxed

## 2022-01-14 NOTE — Progress Notes (Signed)
°  [facility] Internal Medicine Residency Telephone Encounter Continuity Care Appointment  HPI:  This telephone encounter was created for [name] on 01/14/2022 for the following purpose/cc FMLA paperwork.   Past Medical History:  Past Medical History:  Diagnosis Date   Dysmenorrhea    Endometriosis 11/22/2014   Hypertension    Plantar fasciitis of right foot      ROS:  Negative except as stated in HPI   Assessment / Plan / Recommendations:  Please see A&P under problem oriented charting for assessment of the patient's acute and chronic medical conditions.  As always, pt is advised that if symptoms worsen or new symptoms arise, they should go to an urgent care facility or to to ER for further evaluation.   Consent and Medical Decision Making:  Patient discussed with Dr. Evette Doffing This is a telephone encounter between Andrea Long and Andrea Long on 01/14/2022 for FMLA paperwork. The visit was conducted with the patient located at home and [place] at [facility]. The patient's identity was confirmed using their DOB and current address. The patient has consented to being evaluated through a telephone encounter and understands the associated risks (an examination cannot be done and the patient may need to come in for an appointment) / benefits (allows the patient to remain at home, decreasing exposure to coronavirus). I personally spent 8 minutes on medical discussion.

## 2022-01-15 NOTE — Progress Notes (Signed)
Internal Medicine Clinic Attending  Case discussed with Dr. Marva Panda  At the time of the visit.  We reviewed the residents history and pertinent patient test results.  I agree with the assessment, diagnosis, and plan of care documented in the residents note.

## 2022-01-15 NOTE — Addendum Note (Signed)
Addended by: Lalla Brothers T on: 01/15/2022 09:51 AM   Modules accepted: Level of Service

## 2022-01-27 ENCOUNTER — Encounter: Payer: Self-pay | Admitting: *Deleted

## 2022-02-02 ENCOUNTER — Other Ambulatory Visit (HOSPITAL_COMMUNITY): Payer: Self-pay

## 2022-03-02 ENCOUNTER — Other Ambulatory Visit (HOSPITAL_COMMUNITY): Payer: Self-pay

## 2022-03-08 ENCOUNTER — Encounter: Payer: PRIVATE HEALTH INSURANCE | Admitting: Internal Medicine

## 2022-03-09 ENCOUNTER — Encounter: Payer: Self-pay | Admitting: Internal Medicine

## 2022-03-26 ENCOUNTER — Other Ambulatory Visit (HOSPITAL_COMMUNITY): Payer: Self-pay

## 2022-05-13 ENCOUNTER — Other Ambulatory Visit (HOSPITAL_COMMUNITY): Payer: Self-pay

## 2022-05-19 ENCOUNTER — Other Ambulatory Visit (HOSPITAL_COMMUNITY): Payer: Self-pay

## 2023-02-12 IMAGING — MG DIGITAL SCREENING BILAT W/ CAD
4 series · 4 of 4 positions shown · non-contrast
Comparison: Previous exam(s).

CLINICAL DATA: Screening.

EXAM:
DIGITAL SCREENING BILATERAL MAMMOGRAM WITH CAD
TECHNIQUE: Bilateral screening digital craniocaudal and mediolateral oblique
mammograms were obtained. The images were evaluated with
computer-aided detection.

[L MLO]
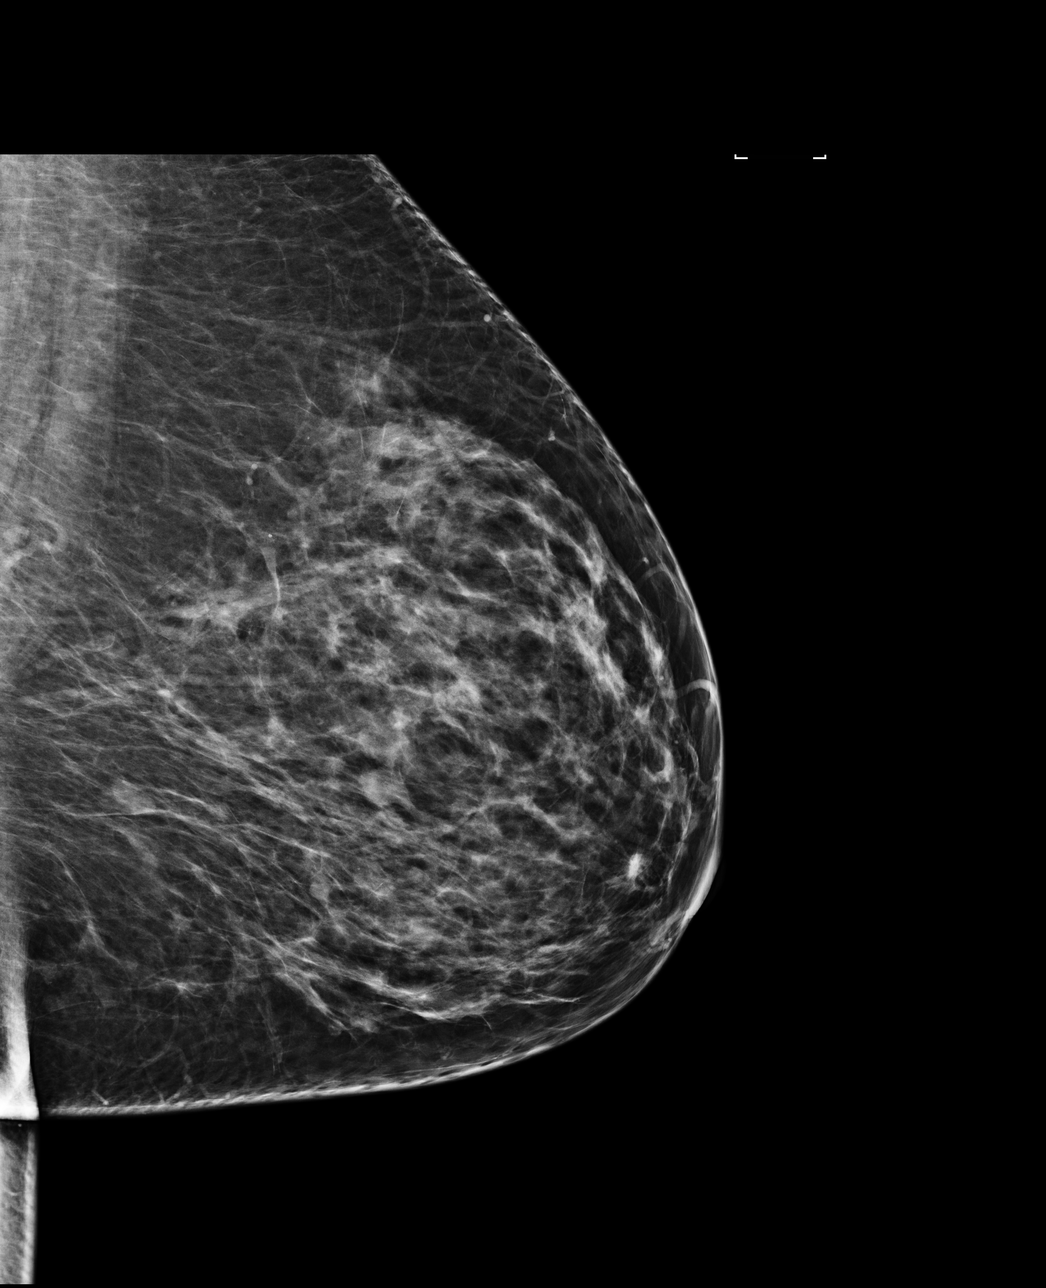

[R CC]
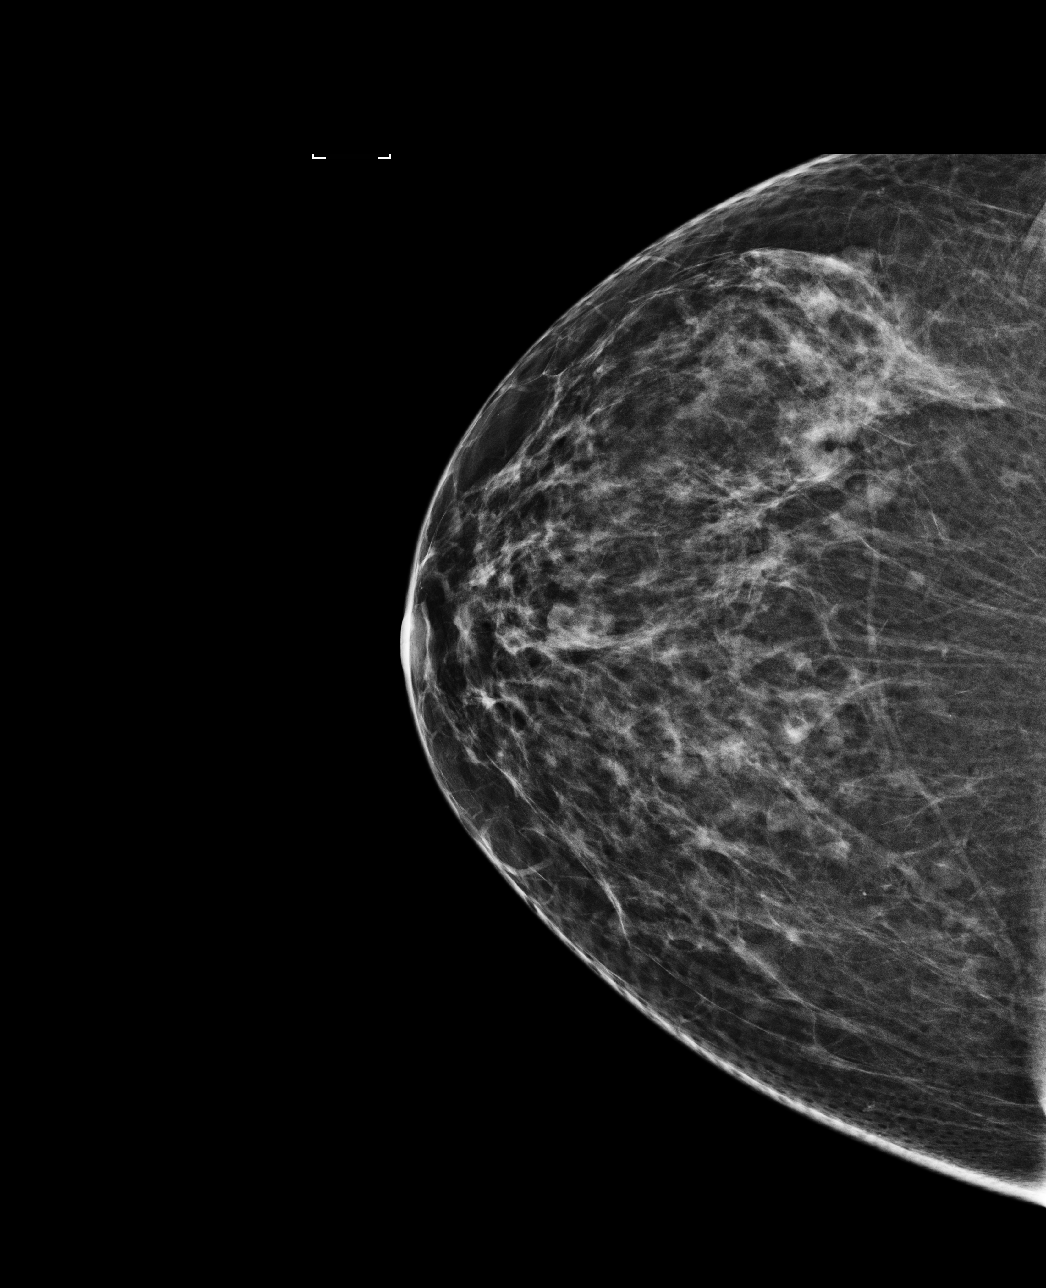

[R MLO]
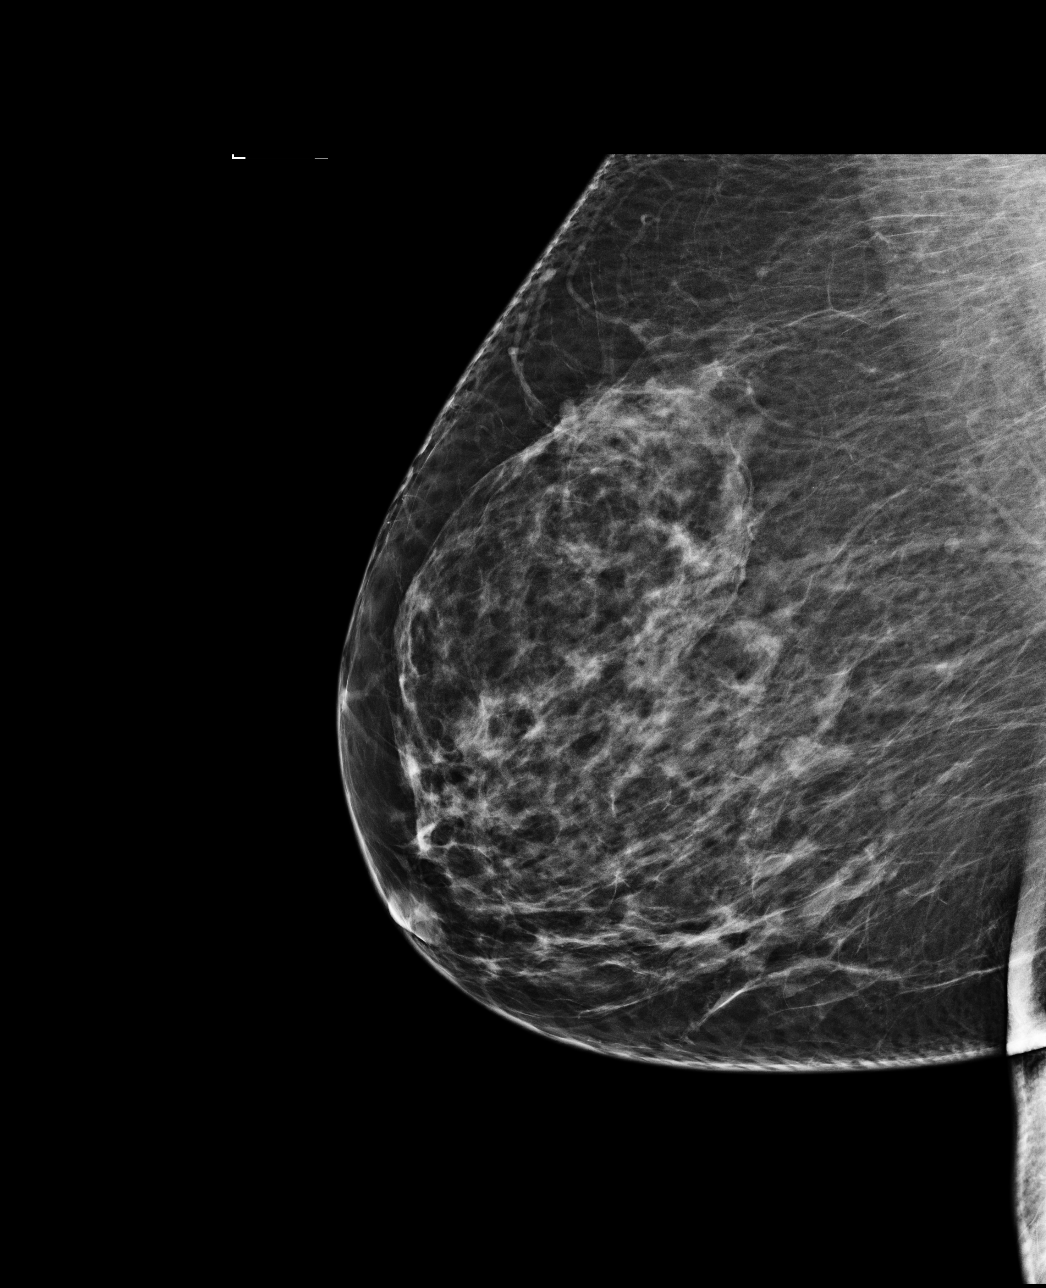

[L CC]
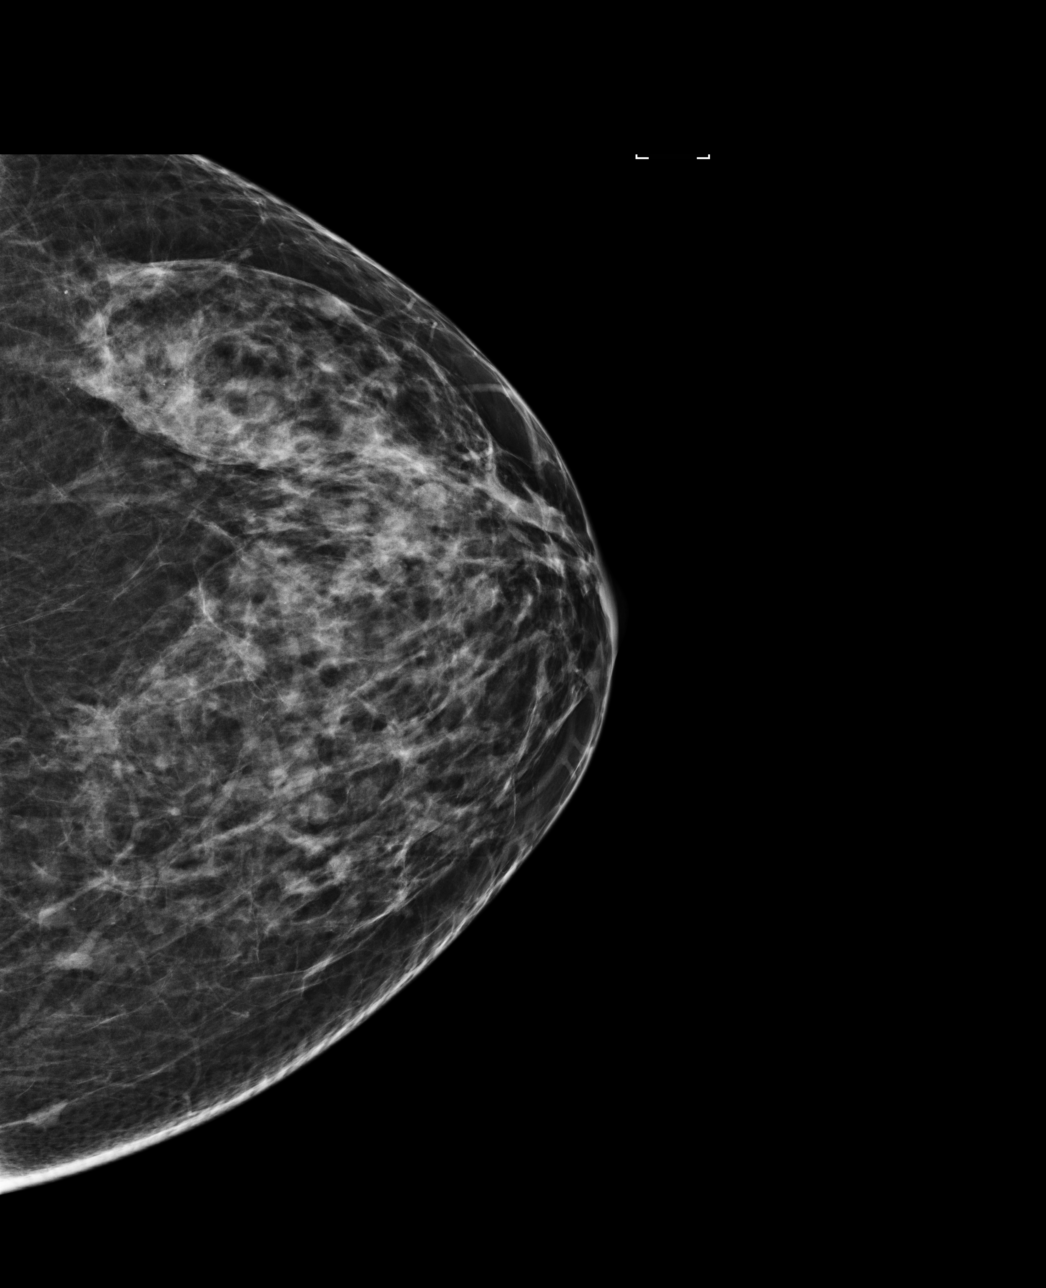

[4 of 4 positions shown; findings below may reference images not displayed]

ACR Breast Density Category c: The breast tissue is heterogeneously
dense, which may obscure small masses.
FINDINGS: There are no findings suspicious for malignancy.
IMPRESSION: No mammographic evidence of malignancy. A result letter of this
screening mammogram will be mailed directly to the patient.

RECOMMENDATION:
Screening mammogram in one year. (Code:TJ-B-ZPA)

BI-RADS CATEGORY  1: Negative.
